# Patient Record
Sex: Female | Born: 1955
Health system: Southern US, Community
[De-identification: ages and names within clinical notes are randomized; demographics above are authoritative.]

## PROBLEM LIST (undated history)

## (undated) DIAGNOSIS — Z9289 Personal history of other medical treatment: Secondary | ICD-10-CM

## (undated) DIAGNOSIS — C801 Malignant (primary) neoplasm, unspecified: Secondary | ICD-10-CM

## (undated) DIAGNOSIS — K219 Gastro-esophageal reflux disease without esophagitis: Secondary | ICD-10-CM

## (undated) DIAGNOSIS — B019 Varicella without complication: Secondary | ICD-10-CM

## (undated) HISTORY — DX: Varicella without complication: B01.9

## (undated) HISTORY — DX: Personal history of other medical treatment: Z92.89

## (undated) HISTORY — PX: TONSILLECTOMY: SUR1361

---

## 2009-11-22 ENCOUNTER — Ambulatory Visit (HOSPITAL_COMMUNITY): Admission: RE | Admit: 2009-11-22 | Discharge: 2009-11-22 | Payer: Self-pay | Admitting: Family Medicine

## 2009-12-14 ENCOUNTER — Encounter: Admission: RE | Admit: 2009-12-14 | Discharge: 2009-12-14 | Payer: Self-pay | Admitting: Family Medicine

## 2009-12-19 ENCOUNTER — Ambulatory Visit: Payer: Self-pay | Admitting: Oncology

## 2009-12-20 ENCOUNTER — Encounter: Admission: RE | Admit: 2009-12-20 | Discharge: 2009-12-20 | Payer: Self-pay | Admitting: Family Medicine

## 2009-12-22 ENCOUNTER — Encounter: Admission: RE | Admit: 2009-12-22 | Discharge: 2009-12-22 | Payer: Self-pay | Admitting: Surgery

## 2009-12-27 ENCOUNTER — Encounter: Admission: RE | Admit: 2009-12-27 | Discharge: 2009-12-27 | Payer: Self-pay | Admitting: Family Medicine

## 2009-12-27 LAB — COMPREHENSIVE METABOLIC PANEL
ALT: 21 U/L (ref 0–35)
AST: 20 U/L (ref 0–37)
Albumin: 4.6 g/dL (ref 3.5–5.2)
Alkaline Phosphatase: 59 U/L (ref 39–117)
Glucose, Bld: 95 mg/dL (ref 70–99)
Potassium: 3.9 mEq/L (ref 3.5–5.3)
Sodium: 141 mEq/L (ref 135–145)
Total Protein: 7 g/dL (ref 6.0–8.3)

## 2009-12-27 LAB — CBC WITH DIFFERENTIAL/PLATELET
BASO%: 0.3 % (ref 0.0–2.0)
EOS%: 1.2 % (ref 0.0–7.0)
Eosinophils Absolute: 0.1 10*3/uL (ref 0.0–0.5)
MCHC: 34.4 g/dL (ref 31.5–36.0)
MCV: 92.1 fL (ref 79.5–101.0)
MONO%: 5.9 % (ref 0.0–14.0)
NEUT#: 3.6 10*3/uL (ref 1.5–6.5)
RBC: 4.44 10*6/uL (ref 3.70–5.45)
RDW: 12.7 % (ref 11.2–14.5)

## 2010-01-02 ENCOUNTER — Ambulatory Visit (HOSPITAL_COMMUNITY): Admission: RE | Admit: 2010-01-02 | Discharge: 2010-01-02 | Payer: Self-pay | Admitting: Oncology

## 2010-01-04 ENCOUNTER — Ambulatory Visit (HOSPITAL_COMMUNITY): Admission: RE | Admit: 2010-01-04 | Discharge: 2010-01-04 | Payer: Self-pay | Admitting: Oncology

## 2010-01-04 ENCOUNTER — Encounter: Payer: Self-pay | Admitting: Oncology

## 2010-01-08 ENCOUNTER — Ambulatory Visit (HOSPITAL_COMMUNITY): Admission: RE | Admit: 2010-01-08 | Discharge: 2010-01-08 | Payer: Self-pay | Admitting: Surgery

## 2010-01-17 LAB — CBC WITH DIFFERENTIAL/PLATELET
EOS%: 1.3 % (ref 0.0–7.0)
MCH: 32 pg (ref 25.1–34.0)
MCV: 92.1 fL (ref 79.5–101.0)
MONO%: 2.2 % (ref 0.0–14.0)
NEUT#: 1.8 10*3/uL (ref 1.5–6.5)
RBC: 3.95 10*6/uL (ref 3.70–5.45)
RDW: 12.7 % (ref 11.2–14.5)

## 2010-01-24 ENCOUNTER — Ambulatory Visit: Payer: Self-pay | Admitting: Oncology

## 2010-01-25 LAB — CBC WITH DIFFERENTIAL/PLATELET
Eosinophils Absolute: 0.1 10*3/uL (ref 0.0–0.5)
MONO#: 1.4 10*3/uL — ABNORMAL HIGH (ref 0.1–0.9)
MONO%: 9.7 % (ref 0.0–14.0)
NEUT#: 11.1 10*3/uL — ABNORMAL HIGH (ref 1.5–6.5)
RBC: 4.29 10*6/uL (ref 3.70–5.45)
RDW: 12.8 % (ref 11.2–14.5)
WBC: 14.2 10*3/uL — ABNORMAL HIGH (ref 3.9–10.3)

## 2010-01-25 LAB — COMPREHENSIVE METABOLIC PANEL
Albumin: 4.5 g/dL (ref 3.5–5.2)
Alkaline Phosphatase: 83 U/L (ref 39–117)
CO2: 25 mEq/L (ref 19–32)
Glucose, Bld: 141 mg/dL — ABNORMAL HIGH (ref 70–99)
Potassium: 3.7 mEq/L (ref 3.5–5.3)
Sodium: 140 mEq/L (ref 135–145)
Total Protein: 6.7 g/dL (ref 6.0–8.3)

## 2010-02-01 LAB — CBC WITH DIFFERENTIAL/PLATELET
Basophils Absolute: 0.1 10*3/uL (ref 0.0–0.1)
Eosinophils Absolute: 0 10*3/uL (ref 0.0–0.5)
HCT: 35.5 % (ref 34.8–46.6)
HGB: 12.3 g/dL (ref 11.6–15.9)
MCH: 30.6 pg (ref 25.1–34.0)
MONO#: 0.2 10*3/uL (ref 0.1–0.9)
NEUT#: 5 10*3/uL (ref 1.5–6.5)
NEUT%: 84.4 % — ABNORMAL HIGH (ref 38.4–76.8)
RDW: 12.7 % (ref 11.2–14.5)
WBC: 5.9 10*3/uL (ref 3.9–10.3)
lymph#: 0.6 10*3/uL — ABNORMAL LOW (ref 0.9–3.3)

## 2010-02-08 LAB — COMPREHENSIVE METABOLIC PANEL
Albumin: 4.3 g/dL (ref 3.5–5.2)
Alkaline Phosphatase: 110 U/L (ref 39–117)
BUN: 9 mg/dL (ref 6–23)
CO2: 26 mEq/L (ref 19–32)
Calcium: 9.2 mg/dL (ref 8.4–10.5)
Chloride: 103 mEq/L (ref 96–112)
Glucose, Bld: 92 mg/dL (ref 70–99)
Potassium: 4.1 mEq/L (ref 3.5–5.3)
Sodium: 141 mEq/L (ref 135–145)
Total Protein: 6.4 g/dL (ref 6.0–8.3)

## 2010-02-08 LAB — CBC WITH DIFFERENTIAL/PLATELET
BASO%: 1.2 % (ref 0.0–2.0)
EOS%: 0.2 % (ref 0.0–7.0)
HCT: 35.4 % (ref 34.8–46.6)
LYMPH%: 9 % — ABNORMAL LOW (ref 14.0–49.7)
MCH: 30.6 pg (ref 25.1–34.0)
MCHC: 34.2 g/dL (ref 31.5–36.0)
NEUT%: 80.1 % — ABNORMAL HIGH (ref 38.4–76.8)
Platelets: 258 10*3/uL (ref 145–400)
RBC: 3.96 10*6/uL (ref 3.70–5.45)

## 2010-02-15 LAB — CBC WITH DIFFERENTIAL/PLATELET
BASO%: 0.5 % (ref 0.0–2.0)
EOS%: 0.1 % (ref 0.0–7.0)
MCH: 30.3 pg (ref 25.1–34.0)
MCHC: 34.3 g/dL (ref 31.5–36.0)
MONO#: 0.3 10*3/uL (ref 0.1–0.9)
NEUT%: 89.5 % — ABNORMAL HIGH (ref 38.4–76.8)
RBC: 3.6 10*6/uL — ABNORMAL LOW (ref 3.70–5.45)
RDW: 13.3 % (ref 11.2–14.5)
WBC: 8.5 10*3/uL (ref 3.9–10.3)
lymph#: 0.5 10*3/uL — ABNORMAL LOW (ref 0.9–3.3)
nRBC: 0 % (ref 0–0)

## 2010-02-22 LAB — CBC WITH DIFFERENTIAL/PLATELET
Eosinophils Absolute: 0 10*3/uL (ref 0.0–0.5)
MCV: 90.5 fL (ref 79.5–101.0)
MONO%: 14.9 % — ABNORMAL HIGH (ref 0.0–14.0)
NEUT#: 6.8 10*3/uL — ABNORMAL HIGH (ref 1.5–6.5)
RBC: 3.58 10*6/uL — ABNORMAL LOW (ref 3.70–5.45)
RDW: 14.3 % (ref 11.2–14.5)
WBC: 9.2 10*3/uL (ref 3.9–10.3)
lymph#: 1 10*3/uL (ref 0.9–3.3)

## 2010-02-22 LAB — COMPREHENSIVE METABOLIC PANEL
AST: 19 U/L (ref 0–37)
Albumin: 4.2 g/dL (ref 3.5–5.2)
Alkaline Phosphatase: 106 U/L (ref 39–117)
Chloride: 108 mEq/L (ref 96–112)
Glucose, Bld: 80 mg/dL (ref 70–99)
Potassium: 4 mEq/L (ref 3.5–5.3)
Sodium: 141 mEq/L (ref 135–145)
Total Protein: 5.9 g/dL — ABNORMAL LOW (ref 6.0–8.3)

## 2010-02-23 ENCOUNTER — Ambulatory Visit: Payer: Self-pay | Admitting: Oncology

## 2010-02-27 ENCOUNTER — Ambulatory Visit (HOSPITAL_COMMUNITY): Admission: RE | Admit: 2010-02-27 | Discharge: 2010-02-27 | Payer: Self-pay | Admitting: Oncology

## 2010-03-01 LAB — CBC WITH DIFFERENTIAL/PLATELET
Basophils Absolute: 0.1 10*3/uL (ref 0.0–0.1)
Eosinophils Absolute: 0 10*3/uL (ref 0.0–0.5)
HCT: 30 % — ABNORMAL LOW (ref 34.8–46.6)
HGB: 10 g/dL — ABNORMAL LOW (ref 11.6–15.9)
MONO#: 0.3 10*3/uL (ref 0.1–0.9)
NEUT%: 87.4 % — ABNORMAL HIGH (ref 38.4–76.8)
WBC: 8.2 10*3/uL (ref 3.9–10.3)
lymph#: 0.6 10*3/uL — ABNORMAL LOW (ref 0.9–3.3)

## 2010-03-08 LAB — COMPREHENSIVE METABOLIC PANEL
AST: 20 U/L (ref 0–37)
Albumin: 4.2 g/dL (ref 3.5–5.2)
BUN: 11 mg/dL (ref 6–23)
Calcium: 9.4 mg/dL (ref 8.4–10.5)
Chloride: 106 mEq/L (ref 96–112)
Glucose, Bld: 129 mg/dL — ABNORMAL HIGH (ref 70–99)
Potassium: 3.7 mEq/L (ref 3.5–5.3)

## 2010-03-08 LAB — CBC WITH DIFFERENTIAL/PLATELET
BASO%: 0.2 % (ref 0.0–2.0)
Basophils Absolute: 0 10*3/uL (ref 0.0–0.1)
EOS%: 0 % (ref 0.0–7.0)
HGB: 10.6 g/dL — ABNORMAL LOW (ref 11.6–15.9)
MCH: 31.1 pg (ref 25.1–34.0)
RDW: 16.4 % — ABNORMAL HIGH (ref 11.2–14.5)
lymph#: 0.6 10*3/uL — ABNORMAL LOW (ref 0.9–3.3)

## 2010-03-15 LAB — CBC WITH DIFFERENTIAL/PLATELET
Eosinophils Absolute: 0 10*3/uL (ref 0.0–0.5)
MONO#: 1.8 10*3/uL — ABNORMAL HIGH (ref 0.1–0.9)
NEUT#: 18.2 10*3/uL — ABNORMAL HIGH (ref 1.5–6.5)
Platelets: 214 10*3/uL (ref 145–400)
RBC: 3.4 10*6/uL — ABNORMAL LOW (ref 3.70–5.45)
RDW: 16.3 % — ABNORMAL HIGH (ref 11.2–14.5)
WBC: 21.3 10*3/uL — ABNORMAL HIGH (ref 3.9–10.3)
lymph#: 1.2 10*3/uL (ref 0.9–3.3)

## 2010-03-22 LAB — CBC WITH DIFFERENTIAL/PLATELET
Eosinophils Absolute: 0 10*3/uL (ref 0.0–0.5)
HCT: 34.1 % — ABNORMAL LOW (ref 34.8–46.6)
HGB: 11.3 g/dL — ABNORMAL LOW (ref 11.6–15.9)
LYMPH%: 6.9 % — ABNORMAL LOW (ref 14.0–49.7)
MONO#: 1.9 10*3/uL — ABNORMAL HIGH (ref 0.1–0.9)
NEUT#: 14.9 10*3/uL — ABNORMAL HIGH (ref 1.5–6.5)
NEUT%: 81.9 % — ABNORMAL HIGH (ref 38.4–76.8)
Platelets: 289 10*3/uL (ref 145–400)
WBC: 18.2 10*3/uL — ABNORMAL HIGH (ref 3.9–10.3)

## 2010-03-22 LAB — COMPREHENSIVE METABOLIC PANEL
CO2: 26 mEq/L (ref 19–32)
Calcium: 9.3 mg/dL (ref 8.4–10.5)
Chloride: 105 mEq/L (ref 96–112)
Creatinine, Ser: 0.57 mg/dL (ref 0.40–1.20)
Glucose, Bld: 89 mg/dL (ref 70–99)
Total Bilirubin: 0.4 mg/dL (ref 0.3–1.2)
Total Protein: 6.3 g/dL (ref 6.0–8.3)

## 2010-03-27 ENCOUNTER — Ambulatory Visit: Payer: Self-pay | Admitting: Oncology

## 2010-03-29 LAB — CBC WITH DIFFERENTIAL/PLATELET
BASO%: 0.8 % (ref 0.0–2.0)
Eosinophils Absolute: 0 10*3/uL (ref 0.0–0.5)
HCT: 29.4 % — ABNORMAL LOW (ref 34.8–46.6)
LYMPH%: 14.6 % (ref 14.0–49.7)
MONO#: 0.5 10*3/uL (ref 0.1–0.9)
NEUT#: 5.7 10*3/uL (ref 1.5–6.5)
Platelets: 221 10*3/uL (ref 145–400)
RBC: 3.16 10*6/uL — ABNORMAL LOW (ref 3.70–5.45)
WBC: 7.4 10*3/uL (ref 3.9–10.3)
lymph#: 1.1 10*3/uL (ref 0.9–3.3)
nRBC: 0 % (ref 0–0)

## 2010-04-05 ENCOUNTER — Encounter (HOSPITAL_COMMUNITY): Admission: RE | Admit: 2010-04-05 | Discharge: 2010-04-05 | Payer: Self-pay | Admitting: Oncology

## 2010-04-05 LAB — CBC WITH DIFFERENTIAL/PLATELET
Basophils Absolute: 0 10*3/uL (ref 0.0–0.1)
EOS%: 2.3 % (ref 0.0–7.0)
Eosinophils Absolute: 0.1 10*3/uL (ref 0.0–0.5)
HCT: 25.6 % — ABNORMAL LOW (ref 34.8–46.6)
HGB: 8.5 g/dL — ABNORMAL LOW (ref 11.6–15.9)
LYMPH%: 16.3 % (ref 14.0–49.7)
MCH: 31.4 pg (ref 25.1–34.0)
MCV: 94.5 fL (ref 79.5–101.0)
MONO%: 7.7 % (ref 0.0–14.0)
NEUT#: 3.5 10*3/uL (ref 1.5–6.5)
NEUT%: 73.1 % (ref 38.4–76.8)
Platelets: 239 10*3/uL (ref 145–400)
RDW: 16.7 % — ABNORMAL HIGH (ref 11.2–14.5)

## 2010-04-12 LAB — COMPREHENSIVE METABOLIC PANEL
AST: 25 U/L (ref 0–37)
Albumin: 4.5 g/dL (ref 3.5–5.2)
BUN: 14 mg/dL (ref 6–23)
Calcium: 9.1 mg/dL (ref 8.4–10.5)
Chloride: 104 mEq/L (ref 96–112)
Glucose, Bld: 90 mg/dL (ref 70–99)
Potassium: 3.8 mEq/L (ref 3.5–5.3)
Total Protein: 6.4 g/dL (ref 6.0–8.3)

## 2010-04-12 LAB — CBC WITH DIFFERENTIAL/PLATELET
HCT: 38.2 % (ref 34.8–46.6)
LYMPH%: 21.9 % (ref 14.0–49.7)
MCH: 32 pg (ref 25.1–34.0)
MCHC: 33.8 g/dL (ref 31.5–36.0)
MCV: 94.8 fL (ref 79.5–101.0)
MONO#: 0.5 10*3/uL (ref 0.1–0.9)
MONO%: 15 % — ABNORMAL HIGH (ref 0.0–14.0)
NEUT%: 58.6 % (ref 38.4–76.8)
Platelets: 217 10*3/uL (ref 145–400)
RBC: 4.03 10*6/uL (ref 3.70–5.45)
WBC: 3.3 10*3/uL — ABNORMAL LOW (ref 3.9–10.3)

## 2010-04-20 LAB — COMPREHENSIVE METABOLIC PANEL WITH GFR
ALT: 16 U/L (ref 0–35)
AST: 21 U/L (ref 0–37)
Albumin: 4.2 g/dL (ref 3.5–5.2)
Alkaline Phosphatase: 53 U/L (ref 39–117)
BUN: 16 mg/dL (ref 6–23)
CO2: 22 meq/L (ref 19–32)
Calcium: 8.8 mg/dL (ref 8.4–10.5)
Chloride: 105 meq/L (ref 96–112)
Creatinine, Ser: 0.54 mg/dL (ref 0.40–1.20)
Glucose, Bld: 101 mg/dL — ABNORMAL HIGH (ref 70–99)
Potassium: 4.1 meq/L (ref 3.5–5.3)
Sodium: 139 meq/L (ref 135–145)
Total Bilirubin: 0.6 mg/dL (ref 0.3–1.2)
Total Protein: 6 g/dL (ref 6.0–8.3)

## 2010-04-20 LAB — CBC WITH DIFFERENTIAL/PLATELET
BASO%: 3.4 % — ABNORMAL HIGH (ref 0.0–2.0)
Basophils Absolute: 0.1 10*3/uL (ref 0.0–0.1)
EOS%: 2.4 % (ref 0.0–7.0)
HGB: 12.8 g/dL (ref 11.6–15.9)
MCH: 32 pg (ref 25.1–34.0)
RBC: 4 10*6/uL (ref 3.70–5.45)
RDW: 14.5 % (ref 11.2–14.5)
lymph#: 0.9 10*3/uL (ref 0.9–3.3)
nRBC: 0 % (ref 0–0)

## 2010-04-26 ENCOUNTER — Ambulatory Visit: Payer: Self-pay | Admitting: Oncology

## 2010-04-26 LAB — CBC WITH DIFFERENTIAL/PLATELET
Eosinophils Absolute: 0 10*3/uL (ref 0.0–0.5)
HGB: 12 g/dL (ref 11.6–15.9)
MONO#: 0.1 10*3/uL (ref 0.1–0.9)
NEUT#: 2.2 10*3/uL (ref 1.5–6.5)
Platelets: 152 10*3/uL (ref 145–400)
RBC: 3.71 10*6/uL (ref 3.70–5.45)
RDW: 13.9 % (ref 11.2–14.5)
WBC: 3.1 10*3/uL — ABNORMAL LOW (ref 3.9–10.3)
nRBC: 0 % (ref 0–0)

## 2010-05-03 LAB — CBC WITH DIFFERENTIAL/PLATELET
BASO%: 0.8 % (ref 0.0–2.0)
Eosinophils Absolute: 0.1 10*3/uL (ref 0.0–0.5)
MONO#: 0.2 10*3/uL (ref 0.1–0.9)
NEUT#: 1.8 10*3/uL (ref 1.5–6.5)
RBC: 3.42 10*6/uL — ABNORMAL LOW (ref 3.70–5.45)
RDW: 13.8 % (ref 11.2–14.5)
WBC: 2.7 10*3/uL — ABNORMAL LOW (ref 3.9–10.3)
lymph#: 0.6 10*3/uL — ABNORMAL LOW (ref 0.9–3.3)

## 2010-05-09 LAB — CBC WITH DIFFERENTIAL/PLATELET
Eosinophils Absolute: 0.1 10*3/uL (ref 0.0–0.5)
HCT: 34.1 % — ABNORMAL LOW (ref 34.8–46.6)
LYMPH%: 23.6 % (ref 14.0–49.7)
MONO#: 0.5 10*3/uL (ref 0.1–0.9)
NEUT#: 2.1 10*3/uL (ref 1.5–6.5)
NEUT%: 58.8 % (ref 38.4–76.8)
Platelets: 220 10*3/uL (ref 145–400)
WBC: 3.5 10*3/uL — ABNORMAL LOW (ref 3.9–10.3)
lymph#: 0.8 10*3/uL — ABNORMAL LOW (ref 0.9–3.3)
nRBC: 0 % (ref 0–0)

## 2010-05-17 LAB — CBC WITH DIFFERENTIAL/PLATELET
Basophils Absolute: 0 10*3/uL (ref 0.0–0.1)
EOS%: 1.9 % (ref 0.0–7.0)
Eosinophils Absolute: 0.1 10*3/uL (ref 0.0–0.5)
HCT: 40.2 % (ref 34.8–46.6)
HGB: 13.8 g/dL (ref 11.6–15.9)
MCH: 32.8 pg (ref 25.1–34.0)
NEUT#: 2.4 10*3/uL (ref 1.5–6.5)
NEUT%: 63.9 % (ref 38.4–76.8)
RDW: 13.7 % (ref 11.2–14.5)
lymph#: 0.8 10*3/uL — ABNORMAL LOW (ref 0.9–3.3)

## 2010-05-22 ENCOUNTER — Ambulatory Visit (HOSPITAL_COMMUNITY): Admission: RE | Admit: 2010-05-22 | Discharge: 2010-05-22 | Payer: Self-pay | Admitting: Oncology

## 2010-06-27 ENCOUNTER — Inpatient Hospital Stay (HOSPITAL_COMMUNITY): Admission: RE | Admit: 2010-06-27 | Discharge: 2010-06-29 | Payer: Self-pay | Admitting: Surgery

## 2010-06-27 ENCOUNTER — Encounter (INDEPENDENT_AMBULATORY_CARE_PROVIDER_SITE_OTHER): Payer: Self-pay | Admitting: Surgery

## 2010-06-29 HISTORY — PX: MASTECTOMY: SHX3

## 2010-07-06 ENCOUNTER — Ambulatory Visit: Payer: Self-pay | Admitting: Oncology

## 2010-07-10 LAB — COMPREHENSIVE METABOLIC PANEL
ALT: 23 U/L (ref 0–35)
AST: 20 U/L (ref 0–37)
Albumin: 4.2 g/dL (ref 3.5–5.2)
Alkaline Phosphatase: 59 U/L (ref 39–117)
BUN: 17 mg/dL (ref 6–23)
CO2: 30 mEq/L (ref 19–32)
Calcium: 9.7 mg/dL (ref 8.4–10.5)
Chloride: 102 mEq/L (ref 96–112)
Creatinine, Ser: 0.6 mg/dL (ref 0.40–1.20)
Glucose, Bld: 97 mg/dL (ref 70–99)
Potassium: 4.2 mEq/L (ref 3.5–5.3)
Sodium: 140 mEq/L (ref 135–145)
Total Bilirubin: 0.4 mg/dL (ref 0.3–1.2)
Total Protein: 6.2 g/dL (ref 6.0–8.3)

## 2010-07-10 LAB — CBC WITH DIFFERENTIAL/PLATELET
BASO%: 0.2 % (ref 0.0–2.0)
Basophils Absolute: 0 10*3/uL (ref 0.0–0.1)
EOS%: 1.2 % (ref 0.0–7.0)
Eosinophils Absolute: 0.1 10*3/uL (ref 0.0–0.5)
HCT: 33.5 % — ABNORMAL LOW (ref 34.8–46.6)
HGB: 11.7 g/dL (ref 11.6–15.9)
LYMPH%: 9.9 % — ABNORMAL LOW (ref 14.0–49.7)
MCH: 32.8 pg (ref 25.1–34.0)
MCHC: 34.9 g/dL (ref 31.5–36.0)
MCV: 93.9 fL (ref 79.5–101.0)
MONO#: 0.4 10*3/uL (ref 0.1–0.9)
MONO%: 5.6 % (ref 0.0–14.0)
NEUT#: 5.4 10*3/uL (ref 1.5–6.5)
NEUT%: 83.1 % — ABNORMAL HIGH (ref 38.4–76.8)
Platelets: 358 10*3/uL (ref 145–400)
RBC: 3.57 10*6/uL — ABNORMAL LOW (ref 3.70–5.45)
RDW: 13.1 % (ref 11.2–14.5)
WBC: 6.5 10*3/uL (ref 3.9–10.3)
lymph#: 0.6 10*3/uL — ABNORMAL LOW (ref 0.9–3.3)

## 2010-07-10 LAB — LUTEINIZING HORMONE: LH: 46.4 m[IU]/mL

## 2010-07-10 LAB — FOLLICLE STIMULATING HORMONE: FSH: 119 m[IU]/mL — ABNORMAL HIGH

## 2010-07-11 ENCOUNTER — Encounter: Admission: RE | Admit: 2010-07-11 | Discharge: 2010-07-11 | Payer: Self-pay | Admitting: Oncology

## 2010-07-17 LAB — ESTRADIOL, ULTRA SENS: Estradiol, Ultra Sensitive: 4 pg/mL

## 2010-07-19 ENCOUNTER — Ambulatory Visit
Admission: RE | Admit: 2010-07-19 | Discharge: 2010-10-17 | Payer: Self-pay | Source: Home / Self Care | Attending: Radiation Oncology | Admitting: Radiation Oncology

## 2010-08-06 ENCOUNTER — Encounter (INDEPENDENT_AMBULATORY_CARE_PROVIDER_SITE_OTHER): Payer: Self-pay | Admitting: Surgery

## 2010-08-06 ENCOUNTER — Ambulatory Visit (HOSPITAL_COMMUNITY): Admission: RE | Admit: 2010-08-06 | Discharge: 2010-08-07 | Payer: Self-pay | Admitting: Surgery

## 2010-08-06 HISTORY — PX: AXILLARY LYMPH NODE DISSECTION: SHX5229

## 2010-08-28 ENCOUNTER — Ambulatory Visit: Payer: Self-pay | Admitting: Oncology

## 2010-08-29 LAB — CBC WITH DIFFERENTIAL/PLATELET
BASO%: 0.5 % (ref 0.0–2.0)
Basophils Absolute: 0 10*3/uL (ref 0.0–0.1)
EOS%: 1.4 % (ref 0.0–7.0)
MCH: 32.2 pg (ref 25.1–34.0)
MCHC: 34.4 g/dL (ref 31.5–36.0)
MCV: 93.5 fL (ref 79.5–101.0)
MONO%: 8.6 % (ref 0.0–14.0)
RBC: 4.13 10*6/uL (ref 3.70–5.45)
RDW: 12.6 % (ref 11.2–14.5)
lymph#: 0.8 10*3/uL — ABNORMAL LOW (ref 0.9–3.3)

## 2010-08-29 LAB — COMPREHENSIVE METABOLIC PANEL
ALT: 20 U/L (ref 0–35)
AST: 17 U/L (ref 0–37)
Albumin: 4.7 g/dL (ref 3.5–5.2)
Alkaline Phosphatase: 67 U/L (ref 39–117)
BUN: 16 mg/dL (ref 6–23)
Potassium: 4.2 mEq/L (ref 3.5–5.3)
Sodium: 143 mEq/L (ref 135–145)

## 2010-10-18 ENCOUNTER — Ambulatory Visit
Admission: RE | Admit: 2010-10-18 | Discharge: 2010-10-30 | Payer: Self-pay | Source: Home / Self Care | Attending: Radiation Oncology | Admitting: Radiation Oncology

## 2010-10-21 ENCOUNTER — Encounter: Payer: Self-pay | Admitting: Family Medicine

## 2010-10-22 ENCOUNTER — Encounter
Admission: RE | Admit: 2010-10-22 | Discharge: 2010-10-30 | Payer: Self-pay | Source: Home / Self Care | Attending: Surgery | Admitting: Surgery

## 2010-10-31 ENCOUNTER — Ambulatory Visit: Payer: Self-pay | Admitting: Radiation Oncology

## 2010-10-31 ENCOUNTER — Ambulatory Visit: Payer: BC Managed Care – PPO | Attending: Radiation Oncology | Admitting: Radiation Oncology

## 2010-10-31 DIAGNOSIS — M79609 Pain in unspecified limb: Secondary | ICD-10-CM | POA: Insufficient documentation

## 2010-10-31 DIAGNOSIS — L589 Radiodermatitis, unspecified: Secondary | ICD-10-CM | POA: Insufficient documentation

## 2010-10-31 DIAGNOSIS — C50219 Malignant neoplasm of upper-inner quadrant of unspecified female breast: Secondary | ICD-10-CM | POA: Insufficient documentation

## 2010-10-31 DIAGNOSIS — Z510 Encounter for antineoplastic radiation therapy: Secondary | ICD-10-CM | POA: Insufficient documentation

## 2010-10-31 DIAGNOSIS — Y842 Radiological procedure and radiotherapy as the cause of abnormal reaction of the patient, or of later complication, without mention of misadventure at the time of the procedure: Secondary | ICD-10-CM | POA: Insufficient documentation

## 2010-11-05 ENCOUNTER — Ambulatory Visit: Payer: BC Managed Care – PPO | Admitting: Physical Therapy

## 2010-11-06 ENCOUNTER — Encounter (HOSPITAL_BASED_OUTPATIENT_CLINIC_OR_DEPARTMENT_OTHER): Payer: BC Managed Care – PPO | Admitting: Oncology

## 2010-11-06 ENCOUNTER — Other Ambulatory Visit: Payer: Self-pay | Admitting: Physician Assistant

## 2010-11-06 DIAGNOSIS — G609 Hereditary and idiopathic neuropathy, unspecified: Secondary | ICD-10-CM

## 2010-11-06 DIAGNOSIS — C50219 Malignant neoplasm of upper-inner quadrant of unspecified female breast: Secondary | ICD-10-CM

## 2010-11-06 DIAGNOSIS — Z170 Estrogen receptor positive status [ER+]: Secondary | ICD-10-CM

## 2010-11-06 LAB — COMPREHENSIVE METABOLIC PANEL
Albumin: 4.5 g/dL (ref 3.5–5.2)
Alkaline Phosphatase: 57 U/L (ref 39–117)
BUN: 13 mg/dL (ref 6–23)
CO2: 30 mEq/L (ref 19–32)
Calcium: 9.5 mg/dL (ref 8.4–10.5)
Chloride: 103 mEq/L (ref 96–112)
Glucose, Bld: 104 mg/dL — ABNORMAL HIGH (ref 70–99)
Potassium: 3.6 mEq/L (ref 3.5–5.3)

## 2010-11-06 LAB — LACTATE DEHYDROGENASE: LDH: 131 U/L (ref 94–250)

## 2010-11-06 LAB — CBC WITH DIFFERENTIAL/PLATELET
Basophils Absolute: 0 10*3/uL (ref 0.0–0.1)
Eosinophils Absolute: 0.1 10*3/uL (ref 0.0–0.5)
HGB: 13.1 g/dL (ref 11.6–15.9)
MCV: 93.5 fL (ref 79.5–101.0)
MONO%: 4.9 % (ref 0.0–14.0)
NEUT#: 5.4 10*3/uL (ref 1.5–6.5)
RDW: 13.4 % (ref 11.2–14.5)

## 2010-11-07 ENCOUNTER — Ambulatory Visit: Payer: BC Managed Care – PPO | Admitting: Physical Therapy

## 2010-11-12 ENCOUNTER — Ambulatory Visit: Payer: BC Managed Care – PPO | Attending: Oncology | Admitting: Physical Therapy

## 2010-11-12 DIAGNOSIS — M24519 Contracture, unspecified shoulder: Secondary | ICD-10-CM | POA: Insufficient documentation

## 2010-11-12 DIAGNOSIS — IMO0001 Reserved for inherently not codable concepts without codable children: Secondary | ICD-10-CM | POA: Insufficient documentation

## 2010-11-12 DIAGNOSIS — I890 Lymphedema, not elsewhere classified: Secondary | ICD-10-CM | POA: Insufficient documentation

## 2010-11-12 DIAGNOSIS — M25519 Pain in unspecified shoulder: Secondary | ICD-10-CM | POA: Insufficient documentation

## 2010-11-14 ENCOUNTER — Ambulatory Visit: Payer: BC Managed Care – PPO | Admitting: Physical Therapy

## 2010-11-19 ENCOUNTER — Ambulatory Visit: Payer: BC Managed Care – PPO | Admitting: Physical Therapy

## 2010-11-21 ENCOUNTER — Ambulatory Visit: Payer: BC Managed Care – PPO | Admitting: Physical Therapy

## 2010-12-03 ENCOUNTER — Ambulatory Visit: Payer: BC Managed Care – PPO | Attending: Oncology | Admitting: Physical Therapy

## 2010-12-03 DIAGNOSIS — M25519 Pain in unspecified shoulder: Secondary | ICD-10-CM | POA: Insufficient documentation

## 2010-12-03 DIAGNOSIS — M24519 Contracture, unspecified shoulder: Secondary | ICD-10-CM | POA: Insufficient documentation

## 2010-12-03 DIAGNOSIS — I890 Lymphedema, not elsewhere classified: Secondary | ICD-10-CM | POA: Insufficient documentation

## 2010-12-03 DIAGNOSIS — IMO0001 Reserved for inherently not codable concepts without codable children: Secondary | ICD-10-CM | POA: Insufficient documentation

## 2010-12-11 LAB — CBC
HCT: 39.7 % (ref 36.0–46.0)
Hemoglobin: 13.5 g/dL (ref 12.0–15.0)
MCH: 31.3 pg (ref 26.0–34.0)
MCHC: 34 g/dL (ref 30.0–36.0)
RDW: 12.5 % (ref 11.5–15.5)

## 2010-12-11 LAB — SURGICAL PCR SCREEN: Staphylococcus aureus: NEGATIVE

## 2010-12-13 LAB — CBC
HCT: 38.1 % (ref 36.0–46.0)
Hemoglobin: 10.8 g/dL — ABNORMAL LOW (ref 12.0–15.0)
Hemoglobin: 13.5 g/dL (ref 12.0–15.0)
MCH: 31.5 pg (ref 26.0–34.0)
MCH: 32.4 pg (ref 26.0–34.0)
MCHC: 34.2 g/dL (ref 30.0–36.0)
MCHC: 35.4 g/dL (ref 30.0–36.0)
Platelets: 150 10*3/uL (ref 150–400)
Platelets: 164 10*3/uL (ref 150–400)
RBC: 3.1 MIL/uL — ABNORMAL LOW (ref 3.87–5.11)
RDW: 12.7 % (ref 11.5–15.5)
RDW: 12.7 % (ref 11.5–15.5)
WBC: 8.8 10*3/uL (ref 4.0–10.5)

## 2010-12-13 LAB — BASIC METABOLIC PANEL
CO2: 27 mEq/L (ref 19–32)
Calcium: 8.3 mg/dL — ABNORMAL LOW (ref 8.4–10.5)
Creatinine, Ser: 0.55 mg/dL (ref 0.4–1.2)
GFR calc non Af Amer: >60 mL/min (ref >60)
Glucose, Bld: 140 mg/dL — ABNORMAL HIGH (ref 70–99)
Sodium: 137 mEq/L (ref 135–145)

## 2010-12-13 LAB — TYPE AND SCREEN
ABO/RH(D): A POS
Antibody Screen: NEGATIVE

## 2010-12-13 LAB — COMPREHENSIVE METABOLIC PANEL
AST: 22 U/L (ref 0–37)
Albumin: 4.2 g/dL (ref 3.5–5.2)
BUN: 13 mg/dL (ref 6–23)
Calcium: 9.7 mg/dL (ref 8.4–10.5)
Creatinine, Ser: 0.63 mg/dL (ref 0.4–1.2)
GFR calc Af Amer: >60 mL/min (ref >60)
GFR calc non Af Amer: >60 mL/min (ref >60)

## 2010-12-13 LAB — DIFFERENTIAL
Eosinophils Relative: 2 % (ref 0–5)
Lymphocytes Relative: 16 % (ref 12–46)
Lymphs Abs: 0.8 10*3/uL (ref 0.7–4.0)
Monocytes Absolute: 0.4 10*3/uL (ref 0.1–1.0)
Neutro Abs: 3.4 10*3/uL (ref 1.7–7.7)

## 2010-12-13 LAB — SURGICAL PCR SCREEN: Staphylococcus aureus: NEGATIVE

## 2010-12-16 LAB — CROSSMATCH
ABO/RH(D): A POS
Antibody Screen: NEGATIVE

## 2010-12-16 LAB — ABO/RH: ABO/RH(D): A POS

## 2010-12-18 ENCOUNTER — Ambulatory Visit: Payer: BC Managed Care – PPO | Admitting: Physical Therapy

## 2010-12-19 LAB — DIFFERENTIAL
Basophils Absolute: 0 10*3/uL (ref 0.0–0.1)
Eosinophils Absolute: 0.1 10*3/uL (ref 0.0–0.7)
Eosinophils Relative: 2 % (ref 0–5)
Lymphocytes Relative: 21 % (ref 12–46)
Lymphs Abs: 1 10*3/uL (ref 0.7–4.0)
Neutrophils Relative %: 69 % (ref 43–77)

## 2010-12-19 LAB — CBC
HCT: 39.9 % (ref 36.0–46.0)
Platelets: 207 10*3/uL (ref 150–400)
RDW: 12.7 % (ref 11.5–15.5)
WBC: 4.7 10*3/uL (ref 4.0–10.5)

## 2010-12-20 IMAGING — PT NM PET TUM IMG INITIAL (PI) SKULL BASE T - THIGH
7 series · 25 of 25 positions shown · non-contrast
Comparison: None

Addendum Begins

Additional finding:  4 mm nodule in the right middle lobe.  No
associated F D G activity.  This nodule is indeterminate.
Recommend attention on follow-up.
Addendum Ends
CLINICAL DATA: Initial treatment strategy for breast cancer.  New
diagnosis of bilateral breast cancer. Invasive ductal carcinoma.
NUCLEAR MEDICINE PET SKULL BASE TO THIGH
Fasting Blood Glucose:  94
TECHNIQUE: 17.4 mCi F-18 FDG was injected intravenously via the
right antecubital fossa.  Full-ring PET imaging was performed from
the skull base through the mid-thighs 58  minutes after injection.
CT data was obtained and used for attenuation correction and
anatomic localization only.  (This was not acquired as a diagnostic
CT examination.)

[Series 1: pet ac · axial · 3.3mm · 4.69mm/px · z∈[-895,-25]mm · 5 of 267 slices shown]
[im 1/267]
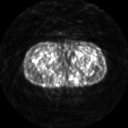
[im 67/267]
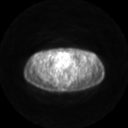
[im 134/267]
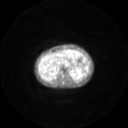
[im 200/267]
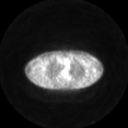
[im 267/267]
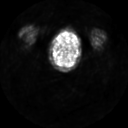

[Series 2: ct images · axial · 3.8mm · 0.98mm/px · z∈[-895,-26]mm · 5 of 267 slices shown]
[im 1/267]
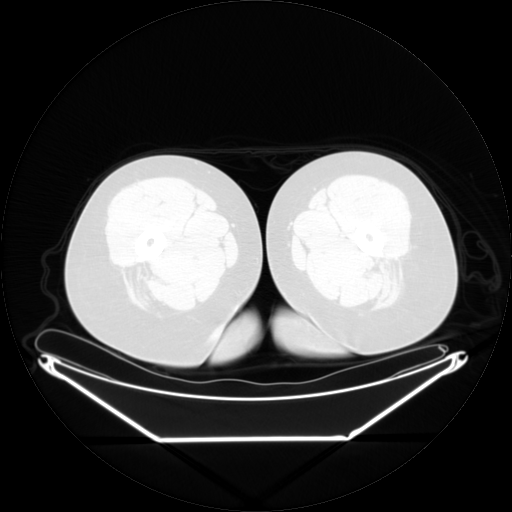
[im 67/267]
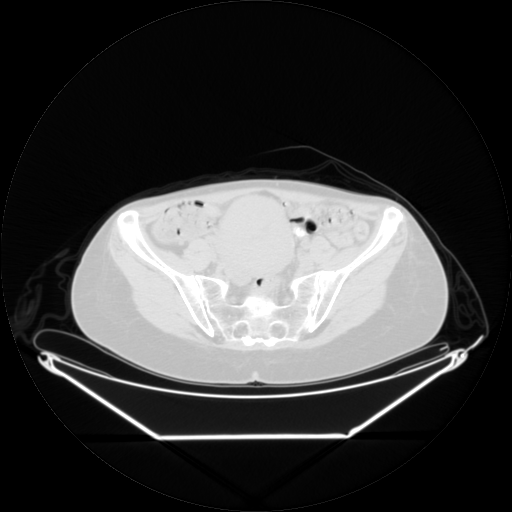
[im 134/267]
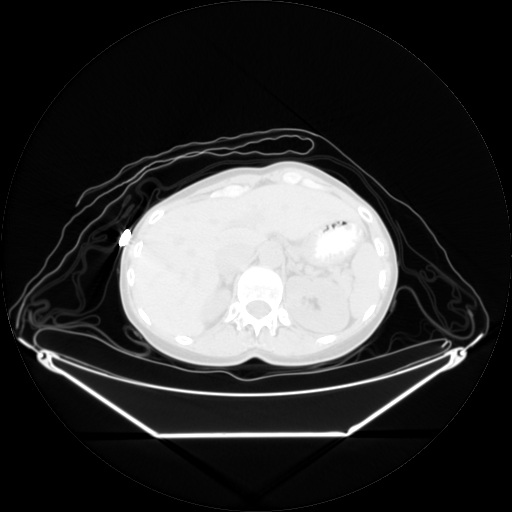
[im 200/267]
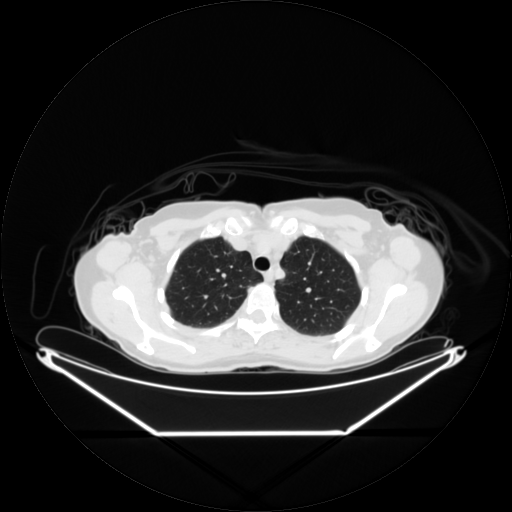
[im 267/267  brain]
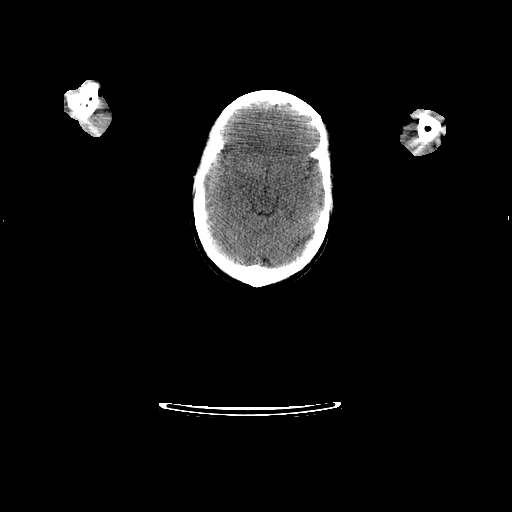

[Series 2: pet nac · axial · 3.3mm · 4.69mm/px · z∈[-895,-25]mm · 5 of 267 slices shown]
[im 1/267]
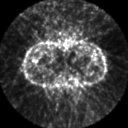
[im 67/267]
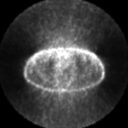
[im 134/267]
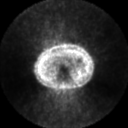
[im 200/267]
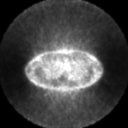
[im 267/267]
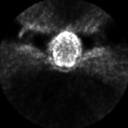

[Series 123: mip · coronal · 3.3mm · 4.69mm/px · 1 of 30 slices shown]
[im 1/30]
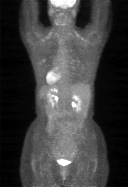

[Series 151: reformatted · axial · 3.3mm · 3.91mm/px · z∈[-895,-25]mm · 6 of 267 slices shown (1 of 3)]
[im 1/267]
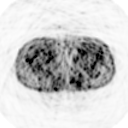
[im 54/267]
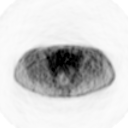
[im 107/267]
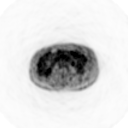
[im 160/267]
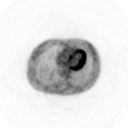
[im 213/267]
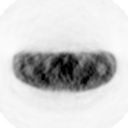
[im 267/267]
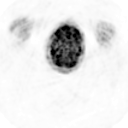

[Series 153: reformatted · coronal · 4.7mm · 6.98mm/px · 2 of 74 slices shown (2 of 3)]
[im 1/74]
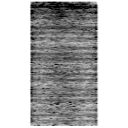
[im 74/74]
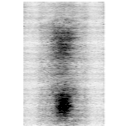

[Series 250: reformatted · axial · 3.8mm · 0.49mm/px · 1 of 1 slices shown (3 of 3)]
[im 1/1]
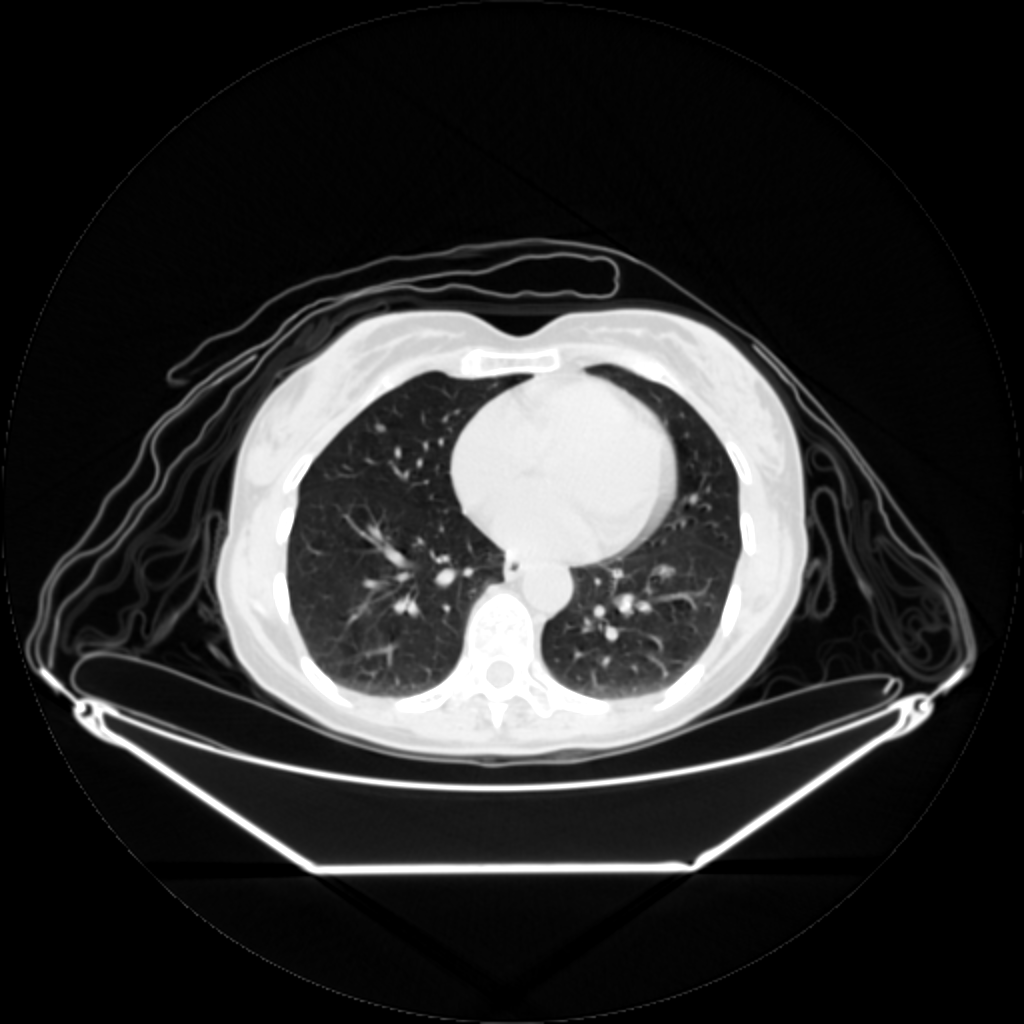

[25 of 25 positions shown; findings below may reference images not displayed]

FINDINGS: Neck:No hypermetabolic nodes in the neck.

Chest:Within the medial left breast there is a focus of increased
metabolic activity adjacent to the biopsy clip (image 87, S U V max
4.0).  There is no evidence of hypermetabolic left axillary nodes.
There is no clear elevated hypermetabolic foci within the right
breast above background glandular activity.  No right axillary
hypermetabolic nodes.

No evidence of hypermetabolic supraclavicular or infraclavicular
nodes.  No hypermetabolic internal mammary nodes.

No hypermetabolic mediastinal or hilar nodes.  No suspicious
pulmonary nodules.

Abdomen / Pelvis:No abnormal hypermetabolic activity within the
solid organs.  No evidence of abdominal or pelvic hypermetabolic
nodes. The uterus is enlarged and globular measuring 13 cm x 11 cm
x 9 cm.

Skeleton:No focal hypermetabolic activity to suggest skeletal
metastasis.
IMPRESSION: 1.  No evidence of breast cancer metastasis.
2.  Focal hypermetabolic activity within the left breast consistent
with primary carcinoma.
3.  Leiomyomatous uterus.

## 2010-12-25 ENCOUNTER — Other Ambulatory Visit: Payer: Self-pay | Admitting: Oncology

## 2010-12-25 ENCOUNTER — Encounter (HOSPITAL_BASED_OUTPATIENT_CLINIC_OR_DEPARTMENT_OTHER): Payer: BC Managed Care – PPO | Admitting: Oncology

## 2010-12-25 DIAGNOSIS — G609 Hereditary and idiopathic neuropathy, unspecified: Secondary | ICD-10-CM

## 2010-12-25 DIAGNOSIS — D649 Anemia, unspecified: Secondary | ICD-10-CM

## 2010-12-25 DIAGNOSIS — C50219 Malignant neoplasm of upper-inner quadrant of unspecified female breast: Secondary | ICD-10-CM

## 2010-12-25 DIAGNOSIS — Z170 Estrogen receptor positive status [ER+]: Secondary | ICD-10-CM

## 2010-12-26 LAB — COMPREHENSIVE METABOLIC PANEL
ALT: 17 U/L (ref 0–35)
Albumin: 4.4 g/dL (ref 3.5–5.2)
Alkaline Phosphatase: 53 U/L (ref 39–117)
CO2: 26 mEq/L (ref 19–32)
Potassium: 3.8 mEq/L (ref 3.5–5.3)
Sodium: 141 mEq/L (ref 135–145)
Total Bilirubin: 0.4 mg/dL (ref 0.3–1.2)
Total Protein: 6 g/dL (ref 6.0–8.3)

## 2010-12-26 IMAGING — CR DG CHEST 1V PORT
1 series · 1 of 1 positions shown · non-contrast
Comparison: CT chest 01/02/2010.

CLINICAL DATA: Port-A-Cath placement.  Breast cancer.

PORTABLE CHEST - 1 VIEW [DATE]/9200 0409 hours:

[view not recorded]
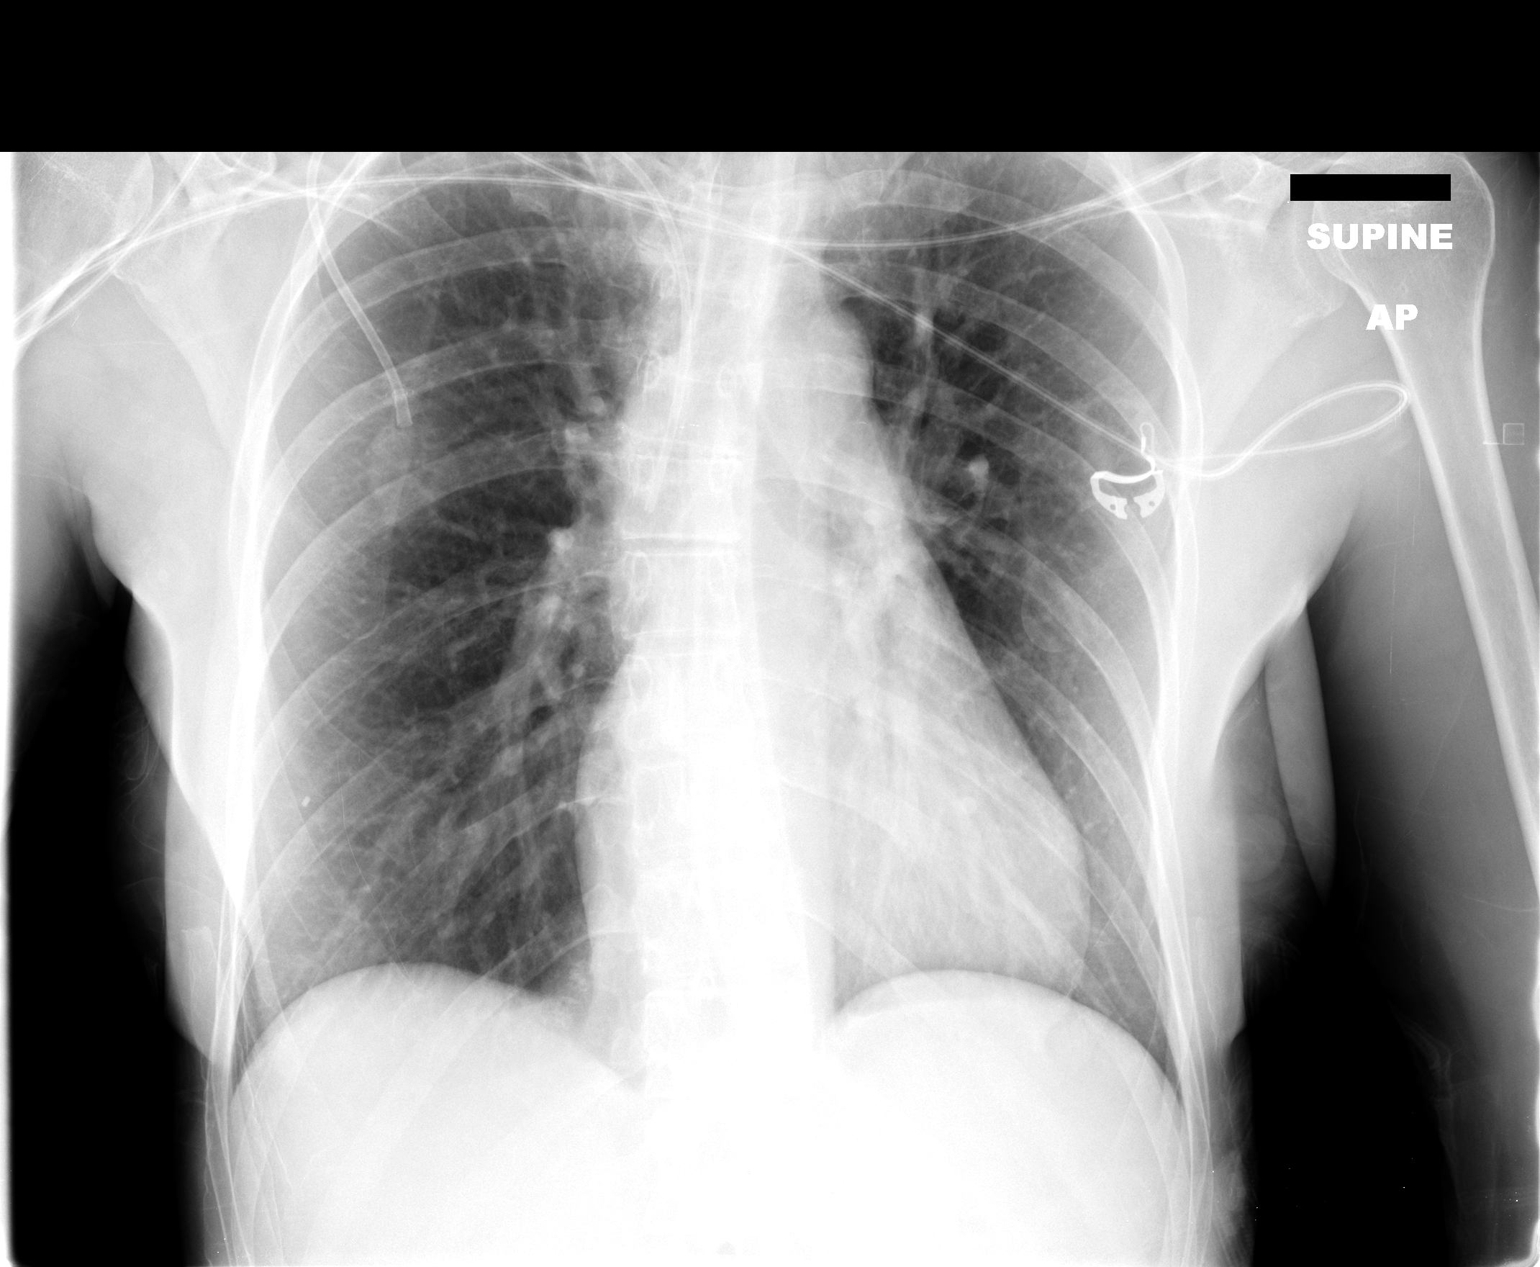

[1 of 1 positions shown; findings below may reference images not displayed]

FINDINGS: Right subclavian Port-A-Cath tip in the mid to upper SVC.
No evidence of pneumothorax or mediastinal hematoma.
Cardiomediastinal silhouette unremarkable for technique.  Lungs
clear.  No visible pleural effusions.
IMPRESSION: 1.  Right subclavian Port-A-Cath tip in the mid to upper SVC.  No
acute complicating features.
2.  No acute cardiopulmonary disease.

Results were discussed directly with Dr. Felner at the time of
interpretation on 01/08/2010 at 3636 hours.

## 2010-12-28 ENCOUNTER — Ambulatory Visit: Payer: BC Managed Care – PPO | Attending: Radiation Oncology | Admitting: Radiation Oncology

## 2010-12-31 ENCOUNTER — Ambulatory Visit: Payer: BC Managed Care – PPO | Attending: Oncology | Admitting: Physical Therapy

## 2010-12-31 DIAGNOSIS — M24519 Contracture, unspecified shoulder: Secondary | ICD-10-CM | POA: Insufficient documentation

## 2010-12-31 DIAGNOSIS — I890 Lymphedema, not elsewhere classified: Secondary | ICD-10-CM | POA: Insufficient documentation

## 2010-12-31 DIAGNOSIS — M25519 Pain in unspecified shoulder: Secondary | ICD-10-CM | POA: Insufficient documentation

## 2010-12-31 DIAGNOSIS — IMO0001 Reserved for inherently not codable concepts without codable children: Secondary | ICD-10-CM | POA: Insufficient documentation

## 2011-01-11 ENCOUNTER — Ambulatory Visit: Payer: BC Managed Care – PPO | Attending: Radiation Oncology | Admitting: Radiation Oncology

## 2011-01-14 ENCOUNTER — Ambulatory Visit: Payer: BC Managed Care – PPO | Admitting: Physical Therapy

## 2011-01-28 ENCOUNTER — Encounter: Payer: BC Managed Care – PPO | Admitting: Physical Therapy

## 2011-06-12 ENCOUNTER — Encounter (HOSPITAL_BASED_OUTPATIENT_CLINIC_OR_DEPARTMENT_OTHER): Payer: BC Managed Care – PPO | Admitting: Oncology

## 2011-06-12 ENCOUNTER — Other Ambulatory Visit: Payer: Self-pay | Admitting: Oncology

## 2011-06-12 DIAGNOSIS — C50219 Malignant neoplasm of upper-inner quadrant of unspecified female breast: Secondary | ICD-10-CM

## 2011-06-12 DIAGNOSIS — G609 Hereditary and idiopathic neuropathy, unspecified: Secondary | ICD-10-CM

## 2011-06-12 DIAGNOSIS — D649 Anemia, unspecified: Secondary | ICD-10-CM

## 2011-06-12 LAB — CBC WITH DIFFERENTIAL/PLATELET
Basophils Absolute: 0 10*3/uL (ref 0.0–0.1)
Eosinophils Absolute: 0 10*3/uL (ref 0.0–0.5)
HCT: 38.2 % (ref 34.8–46.6)
LYMPH%: 15.3 % (ref 14.0–49.7)
MCV: 94.3 fL (ref 79.5–101.0)
MONO#: 0.4 10*3/uL (ref 0.1–0.9)
NEUT#: 3.8 10*3/uL (ref 1.5–6.5)
NEUT%: 75.6 % (ref 38.4–76.8)
Platelets: 202 10*3/uL (ref 145–400)
WBC: 5 10*3/uL (ref 3.9–10.3)

## 2011-06-13 LAB — COMPREHENSIVE METABOLIC PANEL
ALT: 19 U/L (ref 0–35)
AST: 22 U/L (ref 0–37)
Alkaline Phosphatase: 49 U/L (ref 39–117)
Calcium: 9.6 mg/dL (ref 8.4–10.5)
Chloride: 103 mEq/L (ref 96–112)
Creatinine, Ser: 0.68 mg/dL (ref 0.50–1.10)

## 2011-06-21 LAB — ESTRADIOL, ULTRA SENS: Estradiol, Ultra Sensitive: <2 pg/mL

## 2011-07-18 ENCOUNTER — Ambulatory Visit
Admission: RE | Admit: 2011-07-18 | Discharge: 2011-07-18 | Disposition: A | Discharge status: Home / Self Care | Payer: BC Managed Care – PPO | Source: Ambulatory Visit | Attending: Radiation Oncology | Admitting: Radiation Oncology

## 2011-08-09 ENCOUNTER — Encounter (HOSPITAL_BASED_OUTPATIENT_CLINIC_OR_DEPARTMENT_OTHER): Payer: Self-pay | Admitting: *Deleted

## 2011-08-13 ENCOUNTER — Encounter (HOSPITAL_BASED_OUTPATIENT_CLINIC_OR_DEPARTMENT_OTHER)
Admission: RE | Admit: 2011-08-13 | Discharge: 2011-08-13 | Disposition: A | Discharge status: Home / Self Care | Payer: BC Managed Care – PPO | Source: Ambulatory Visit | Attending: Plastic Surgery | Admitting: Plastic Surgery

## 2011-08-13 ENCOUNTER — Other Ambulatory Visit: Payer: Self-pay

## 2011-08-15 ENCOUNTER — Encounter (HOSPITAL_BASED_OUTPATIENT_CLINIC_OR_DEPARTMENT_OTHER): Payer: Self-pay | Admitting: Plastic Surgery

## 2011-08-15 ENCOUNTER — Other Ambulatory Visit: Payer: Self-pay | Admitting: Plastic Surgery

## 2011-08-15 ENCOUNTER — Encounter (HOSPITAL_BASED_OUTPATIENT_CLINIC_OR_DEPARTMENT_OTHER): Payer: Self-pay | Admitting: Anesthesiology

## 2011-08-15 ENCOUNTER — Ambulatory Visit (HOSPITAL_BASED_OUTPATIENT_CLINIC_OR_DEPARTMENT_OTHER): Payer: BC Managed Care – PPO | Admitting: Anesthesiology

## 2011-08-15 ENCOUNTER — Encounter (HOSPITAL_BASED_OUTPATIENT_CLINIC_OR_DEPARTMENT_OTHER): Payer: Self-pay | Admitting: *Deleted

## 2011-08-15 ENCOUNTER — Ambulatory Visit (HOSPITAL_BASED_OUTPATIENT_CLINIC_OR_DEPARTMENT_OTHER)
Admission: RE | Admit: 2011-08-15 | Discharge: 2011-08-15 | Disposition: A | Discharge status: Home / Self Care | Payer: BC Managed Care – PPO | Source: Ambulatory Visit | Attending: Plastic Surgery | Admitting: Plastic Surgery

## 2011-08-15 ENCOUNTER — Encounter (HOSPITAL_BASED_OUTPATIENT_CLINIC_OR_DEPARTMENT_OTHER)
Admission: RE | Disposition: A | Discharge status: Home / Self Care | Payer: Self-pay | Source: Ambulatory Visit | Attending: Plastic Surgery

## 2011-08-15 DIAGNOSIS — C50911 Malignant neoplasm of unspecified site of right female breast: Secondary | ICD-10-CM

## 2011-08-15 DIAGNOSIS — Z421 Encounter for breast reconstruction following mastectomy: Secondary | ICD-10-CM | POA: Insufficient documentation

## 2011-08-15 DIAGNOSIS — Z01810 Encounter for preprocedural cardiovascular examination: Secondary | ICD-10-CM | POA: Insufficient documentation

## 2011-08-15 DIAGNOSIS — Z9889 Other specified postprocedural states: Secondary | ICD-10-CM

## 2011-08-15 HISTORY — PX: BREAST RECONSTRUCTION: SHX9

## 2011-08-15 HISTORY — DX: Malignant (primary) neoplasm, unspecified: C80.1

## 2011-08-15 HISTORY — DX: Gastro-esophageal reflux disease without esophagitis: K21.9

## 2011-08-15 SURGERY — RECONSTRUCTION, BREAST
Anesthesia: Monitor Anesthesia Care | Laterality: Bilateral

## 2011-08-15 SURGERY — Surgical Case
Anesthesia: *Unknown

## 2011-08-15 MED ORDER — ONDANSETRON HCL 4 MG/2ML IJ SOLN: 4.0000 mg | Freq: Once | INTRAMUSCULAR | Status: DC | PRN

## 2011-08-15 MED ORDER — DIAZEPAM 2 MG PO TABS: 2.0000 mg | ORAL_TABLET | Freq: Two times a day (BID) | ORAL | Status: AC | PRN

## 2011-08-15 MED ORDER — DEXAMETHASONE SODIUM PHOSPHATE 4 MG/ML IJ SOLN
INTRAMUSCULAR | Status: DC | PRN
  Administered 2011-08-15: 10 mg via INTRAVENOUS

## 2011-08-15 MED ORDER — SODIUM CHLORIDE 0.9 % IR SOLN: Status: DC | PRN

## 2011-08-15 MED ORDER — LIDOCAINE HCL (CARDIAC) 20 MG/ML IV SOLN
INTRAVENOUS | Status: DC | PRN
  Administered 2011-08-15: 75 mg via INTRAVENOUS

## 2011-08-15 MED ORDER — CEPHALEXIN 500 MG PO CAPS: 500.0000 mg | ORAL_CAPSULE | Freq: Four times a day (QID) | ORAL | Status: AC

## 2011-08-15 MED ORDER — LACTATED RINGERS IV SOLN
INTRAVENOUS | Status: DC
  Administered 2011-08-15 (×3): via INTRAVENOUS

## 2011-08-15 MED ORDER — HYDROCODONE-ACETAMINOPHEN 5-500 MG PO TABS: 1.0000 | ORAL_TABLET | Freq: Four times a day (QID) | ORAL | Status: AC | PRN

## 2011-08-15 MED ORDER — EPHEDRINE SULFATE 50 MG/ML IJ SOLN
INTRAMUSCULAR | Status: DC | PRN
  Administered 2011-08-15 (×2): 10 mg via INTRAVENOUS

## 2011-08-15 MED ORDER — ONDANSETRON HCL 4 MG/2ML IJ SOLN
INTRAMUSCULAR | Status: DC | PRN
  Administered 2011-08-15: 4 mg via INTRAVENOUS

## 2011-08-15 MED ORDER — SODIUM CHLORIDE 0.9 % IR SOLN
Status: DC | PRN
  Administered 2011-08-15: 11:00:00

## 2011-08-15 MED ORDER — DROPERIDOL 2.5 MG/ML IJ SOLN
INTRAMUSCULAR | Status: DC | PRN
  Administered 2011-08-15: 0.625 mg via INTRAVENOUS

## 2011-08-15 MED ORDER — FENTANYL CITRATE 0.05 MG/ML IJ SOLN
INTRAMUSCULAR | Status: DC | PRN
  Administered 2011-08-15: 50 ug via INTRAVENOUS
  Administered 2011-08-15: 25 ug via INTRAVENOUS
  Administered 2011-08-15 (×4): 50 ug via INTRAVENOUS
  Administered 2011-08-15: 25 ug via INTRAVENOUS

## 2011-08-15 MED ORDER — MEPERIDINE HCL 25 MG/ML IJ SOLN: 6.2500 mg | INTRAMUSCULAR | Status: DC | PRN

## 2011-08-15 MED ORDER — PROMETHAZINE HCL 12.5 MG PO TABS: 12.5000 mg | ORAL_TABLET | Freq: Four times a day (QID) | ORAL | Status: AC | PRN

## 2011-08-15 MED ORDER — SODIUM CHLORIDE 0.9 % IR SOLN
Status: DC | PRN
  Administered 2011-08-15: 09:00:00

## 2011-08-15 MED ORDER — HYDROMORPHONE HCL PF 1 MG/ML IJ SOLN
0.2500 mg | INTRAMUSCULAR | Status: DC | PRN
  Administered 2011-08-15 (×3): 0.5 mg via INTRAVENOUS

## 2011-08-15 MED ORDER — MIDAZOLAM HCL 5 MG/5ML IJ SOLN
INTRAMUSCULAR | Status: DC | PRN
  Administered 2011-08-15 (×2): 1 mg via INTRAVENOUS

## 2011-08-15 MED ORDER — PROPOFOL 10 MG/ML IV EMUL
INTRAVENOUS | Status: DC | PRN
  Administered 2011-08-15: 200 mg via INTRAVENOUS

## 2011-08-15 MED ORDER — LACTATED RINGERS IV SOLN: INTRAVENOUS | Status: DC

## 2011-08-15 MED ORDER — CEFAZOLIN SODIUM 1-5 GM-% IV SOLN
1.0000 g | INTRAVENOUS | Status: AC
  Administered 2011-08-15: 1 g via INTRAVENOUS

## 2011-08-15 SURGICAL SUPPLY — 63 items
BAG DECANTER FOR FLEXI CONT (MISCELLANEOUS) ×4 IMPLANT
BANDAGE GAUZE ELAST BULKY 4 IN (GAUZE/BANDAGES/DRESSINGS) ×4 IMPLANT
BINDER BREAST LRG (GAUZE/BANDAGES/DRESSINGS) IMPLANT
BINDER BREAST MEDIUM (GAUZE/BANDAGES/DRESSINGS) ×2 IMPLANT
BINDER BREAST XLRG (GAUZE/BANDAGES/DRESSINGS) IMPLANT
BINDER BREAST XXLRG (GAUZE/BANDAGES/DRESSINGS) IMPLANT
BLADE HEX COATED 2.75 (ELECTRODE) ×2 IMPLANT
BLADE SURG 15 STRL LF DISP TIS (BLADE) ×2 IMPLANT
BLADE SURG 15 STRL SS (BLADE) ×2
CANISTER SUCTION 1200CC (MISCELLANEOUS) IMPLANT
CHLORAPREP W/TINT 26ML (MISCELLANEOUS) ×2 IMPLANT
CLOTH BEACON ORANGE TIMEOUT ST (SAFETY) ×2 IMPLANT
COVER MAYO STAND STRL (DRAPES) ×2 IMPLANT
COVER TABLE BACK 60X90 (DRAPES) ×2 IMPLANT
DECANTER SPIKE VIAL GLASS SM (MISCELLANEOUS) IMPLANT
DERMABOND ADVANCED (GAUZE/BANDAGES/DRESSINGS) ×3
DERMABOND ADVANCED .7 DNX12 (GAUZE/BANDAGES/DRESSINGS) ×3 IMPLANT
DRAIN CHANNEL 19F RND (DRAIN) IMPLANT
DRAPE LAPAROSCOPIC ABDOMINAL (DRAPES) ×2 IMPLANT
DRSG PAD ABDOMINAL 8X10 ST (GAUZE/BANDAGES/DRESSINGS) ×2 IMPLANT
ELECT BLADE 4.0 EZ CLEAN MEGAD (MISCELLANEOUS)
ELECT BLADE 6.5 .24CM SHAFT (ELECTRODE) IMPLANT
ELECT REM PT RETURN 9FT ADLT (ELECTROSURGICAL) ×2
ELECTRODE BLDE 4.0 EZ CLN MEGD (MISCELLANEOUS) IMPLANT
ELECTRODE REM PT RTRN 9FT ADLT (ELECTROSURGICAL) ×1 IMPLANT
EVACUATOR SILICONE 100CC (DRAIN) IMPLANT
GAUZE SPONGE 4X4 12PLY STRL LF (GAUZE/BANDAGES/DRESSINGS) IMPLANT
GLOVE BIO SURGEON STRL SZ 6.5 (GLOVE) ×6 IMPLANT
GOWN PREVENTION PLUS XLARGE (GOWN DISPOSABLE) ×4 IMPLANT
IMPLANT BREAST SILICONE 375CC (Breast) ×4 IMPLANT
IV NS 500ML (IV SOLUTION) ×1
IV NS 500ML BAXH (IV SOLUTION) ×1 IMPLANT
KIT FILL MCGHAN 30CC (MISCELLANEOUS) IMPLANT
NDL SAFETY ECLIPSE 18X1.5 (NEEDLE) ×1 IMPLANT
NEEDLE HYPO 18GX1.5 SHARP (NEEDLE) ×1
NEEDLE HYPO 25X1 1.5 SAFETY (NEEDLE) IMPLANT
NS IRRIG 1000ML POUR BTL (IV SOLUTION) IMPLANT
PACK BASIN DAY SURGERY FS (CUSTOM PROCEDURE TRAY) ×2 IMPLANT
PENCIL BUTTON HOLSTER BLD 10FT (ELECTRODE) ×2 IMPLANT
PIN SAFETY STERILE (MISCELLANEOUS) IMPLANT
SLEEVE SCD COMPRESS KNEE MED (MISCELLANEOUS) ×2 IMPLANT
SPONGE GAUZE 4X4 12PLY (GAUZE/BANDAGES/DRESSINGS) IMPLANT
SPONGE LAP 18X18 X RAY DECT (DISPOSABLE) ×4 IMPLANT
STAPLER VISISTAT 35W (STAPLE) IMPLANT
STRIP CLOSURE SKIN 1/2X4 (GAUZE/BANDAGES/DRESSINGS) IMPLANT
SUT MNCRL AB 3-0 PS2 18 (SUTURE) IMPLANT
SUT MON AB 5-0 PS2 18 (SUTURE) ×4 IMPLANT
SUT PDS AB 2-0 CT2 27 (SUTURE) ×6 IMPLANT
SUT VIC AB 3-0 FS2 27 (SUTURE) IMPLANT
SUT VIC AB 3-0 PS1 18 (SUTURE)
SUT VIC AB 3-0 PS1 18XBRD (SUTURE) IMPLANT
SUT VIC AB 3-0 SH 27 (SUTURE) ×2
SUT VIC AB 3-0 SH 27X BRD (SUTURE) ×2 IMPLANT
SUT VIC AB 5-0 PS2 18 (SUTURE) IMPLANT
SUT VICRYL 4-0 PS2 18IN ABS (SUTURE) ×4 IMPLANT
SYR 50ML LL SCALE MARK (SYRINGE) IMPLANT
SYR BULB IRRIGATION 50ML (SYRINGE) ×2 IMPLANT
SYR CONTROL 10ML LL (SYRINGE) IMPLANT
TOWEL OR 17X24 6PK STRL BLUE (TOWEL DISPOSABLE) ×6 IMPLANT
TUBE CONNECTING 20X1/4 (TUBING) ×2 IMPLANT
UNDERPAD 30X30 INCONTINENT (UNDERPADS AND DIAPERS) ×4 IMPLANT
WATER STERILE IRR 1000ML POUR (IV SOLUTION) IMPLANT
YANKAUER SUCT BULB TIP NO VENT (SUCTIONS) ×2 IMPLANT

## 2011-08-15 NOTE — H&P (Signed)
History and Physical  Sandra [name]   07/30/2011 3:30 PM Office Visit  MRN: [id number]   Provider: Wayland Denis, DO  Department: Plastic Surgery     Diagnoses  -  CA - breast cancer   -      Reason for Visit  -  Pre-op Exam  history and physical       All Notes       Scanned Document signed by Default Authenticator Edi at 08/14/11 1339      Author: Default Authenticator Edi Service: (none) Author Type: (none)    Filed: 08/14/11 1339 Note Time: 07/31/11 1424 Trans ID: [id number]    Trans Status: Available           Subjective:    Patient ID: Sandra [name] is a 55 y.o. female.  HPI Sandra [name] is a 55 yrs old wf here for a post operative follow up on her bilateral breast reconstruction with expander and alloderm placement 06/27/10 and left axillary node dissection (08/06/10). She was diagnosed with bilateral breast cancer in 3/11(invasive ductal cancer (ER/PR +). She finished neoadjuvant chemotherapy 03/2010. She has not had any fevers or other illness since surgery. Radiation to the left finished 11/01/2010. Incision is intact and no sign of infection or skin breakdown. The right skin is very lax. The left is still slightly tight but not as tight as last exam. She is pleased with her current size but likely slightly smaller due to the radiation on the left  The following portions of the patient's history were reviewed and updated as appropriate: allergies, current medications, past family history, past medical history, past social history, past surgical history and problem list.  Review of Systems  Constitutional: Negative.   HENT: Negative.   Eyes: Negative.   Respiratory: Negative.   Cardiovascular: Negative.   Gastrointestinal: Negative.   Genitourinary: Negative.   Musculoskeletal: Negative.   Skin: Negative.   Neurological: Negative.   Hematological: Negative.   Psychiatric/Behavioral: Negative.      Objective:   Physical Exam  Constitutional: She is oriented to person, place,  and time. She appears well-developed and well-nourished.  HENT:   Head: Normocephalic and atraumatic.  Right Ear: External ear normal.  Left Ear: External ear normal.  Eyes: Conjunctivae are normal. Pupils are equal, round, and reactive to light.  Neck: Normal range of motion.  Cardiovascular: Normal rate.   Pulmonary/Chest: Breath sounds normal.  Abdominal: Soft. She exhibits no distension. There is no tenderness.  Musculoskeletal: Normal range of motion.  Neurological: She is alert and oriented to person, place, and time.  Skin: Skin is warm and dry.  Psychiatric: She has a normal mood and affect. Her behavior is normal. Judgment and thought content normal.  Well healed mastectomy scars.    Assessment:  1.  CA - breast cancer        Plan:  The patient currently has 400/600 cc in the right expander and 460/600 in the left expander. We plan to remove the expanders and place silicone implants. Risks and complications reviewed especially with the radiation to the left side which include capsule contracture, infection, exposure of implant, need for revision, loss of implant, distortion, skin loss, seroma, and hematoma.     Follow-up and Disposition     Return in about 3 weeks (around 08/20/2011).         Vitals - Last Recorded       BP Pulse Temp Ht Wt BMI    109/66  69  98.8 F (37.1 C)  1.727 m (5\' 8" )  67.132 kg (148 lb)  22.50 kg/m2

## 2011-08-15 NOTE — Anesthesia Postprocedure Evaluation (Signed)
  Anesthesia Post-op Note  Patient: Sandra [name]  Procedure(s) Performed:  BREAST RECONSTRUCTION - bilateral breast implant exchange; BREAST CAPSULECTOMY WITH IMPLANT EXCHANGE  Patient Location: PACU  Anesthesia Type: General  Level of Consciousness: awake, alert  and oriented  Airway and Oxygen Therapy: Patient Spontanous Breathing  Post-op Pain: mild  Post-op Assessment: Post-op Vital signs reviewed and Patient's Cardiovascular Status Stable  Post-op Vital Signs: stable  Complications: No apparent anesthesia complications

## 2011-08-15 NOTE — Transfer of Care (Signed)
Immediate Anesthesia Transfer of Care Note  Patient: Sandra [name]  Procedure(s) Performed:  BREAST RECONSTRUCTION - bilateral breast implant exchange; BREAST CAPSULECTOMY WITH IMPLANT EXCHANGE  Patient Location: PACU  Anesthesia Type: General  Level of Consciousness: awake and oriented  Airway & Oxygen Therapy: Patient Spontanous Breathing and Patient connected to face mask oxygen  Post-op Assessment: Report given to PACU RN  Post vital signs: stable  Complications: No apparent anesthesia complications

## 2011-08-15 NOTE — H&P (Signed)
  History and Physical  [name]   07/30/2011 3:30 PM Office Visit  MRN: [id number]  Provider: Wayland Denis, DO  Department: Plastic Surgery     Diagnoses  -  CA - breast cancer   -   Reason for Visit  -  Pre-op Exam  history and physical    Subjective:    Patient ID: Sandra [name] is a 55 y.o. female.  HPI Sandra [name] is a 55 yrs old wf here for a post operative follow up on her bilateral breast reconstruction with expander and alloderm placement 06/27/10 and left axillary node dissection (08/06/10). She was diagnosed with bilateral breast cancer in 3/11(invasive ductal cancer (ER/PR +). She finished neoadjuvant chemotherapy 03/2010. She has not had any fevers or other illness since surgery. Radiation to the left finished 11/01/2010. Incision is intact and no sign of infection or skin breakdown. The right skin is very lax. The left is still slightly tight but not as tight as last exam. She is pleased with her current size but likely slightly smaller due to the radiation on the left  The following portions of the patient's history were reviewed and updated as appropriate: allergies, current medications, past family history, past medical history, past social history, past surgical history and problem list.  Review of Systems  Constitutional: Negative.   HENT: Negative.   Eyes: Negative.   Respiratory: Negative.   Cardiovascular: Negative.   Gastrointestinal: Negative.   Genitourinary: Negative.   Musculoskeletal: Negative.   Skin: Negative.   Neurological: Negative.   Hematological: Negative.   Psychiatric/Behavioral: Negative.      Objective:   Physical Exam  Constitutional: She is oriented to person, place, and time. She appears well-developed and well-nourished.  HENT:   Head: Normocephalic and atraumatic.  Right Ear: External ear normal.  Left Ear: External ear normal.  Eyes: Conjunctivae are normal. Pupils are equal, round, and reactive to light.  Neck: Normal range of  motion.  Cardiovascular: Normal rate.   Pulmonary/Chest: Breath sounds normal.  Abdominal: Soft. She exhibits no distension. There is no tenderness.  Musculoskeletal: Normal range of motion.  Neurological: She is alert and oriented to person, place, and time.  Skin: Skin is warm and dry.  Psychiatric: She has a normal mood and affect. Her behavior is normal. Judgment and thought content normal.  Well healed mastectomy scars.    Assessment:  1.  CA - breast cancer        Plan:  The patient currently has 400/600 cc in the right expander and 460/600 in the left expander. We plan to remove the expanders and place of silicone implants. Risks and complications reviewed especially with the radiation to the left side. We discussed the options for size again and agree to stay around 400cc on both sides.  The exam was confirmed and no change in exam, ROS, Social history or medications.     Follow-up and Disposition     Return in about 3 weeks (around 08/20/2011).        Vitals - Last Recorded       BP Pulse Temp Ht Wt BMI from Oct visit.   109/66  69  98.8 F (37.1 C)  1.727 m (5\' 8" )  67.132 kg (148 lb)  22.50 kg/m2

## 2011-08-15 NOTE — Brief Op Note (Signed)
08/15/2011  10:06 AM  PATIENT:  Sandra [name]  55 y.o. female  PRE-OPERATIVE DIAGNOSIS:  Breast cancer [174.9]  POST-OPERATIVE DIAGNOSIS:  S/P Left Breast Cancer w/ Breast Reconstruction  PROCEDURE:  Procedure(s): BILATERAL BREAST RECONSTRUCTION  BILATERAL BREAST CAPSULECTOMY WITH EXPANDER REMOVAL AND IMPLANT PLACEMENT  SURGEON:  Surgeon(s): [employer], DO  PHYSICIAN ASSISTANT:   ASSISTANTS: none   ANESTHESIA:   general  EBL:  Total I/O In: 1100 [I.V.:1100] Out: -   BLOOD ADMINISTERED:none  DRAINS: none   LOCAL MEDICATIONS USED:  NONE  SPECIMEN:  Excision  DISPOSITION OF SPECIMEN:  PATHOLOGY  COUNTS:  YES  TOURNIQUET:  * No tourniquets in log *  DICTATION: .Note written in EPIC  PLAN OF CARE: Discharge to home after PACU  PATIENT DISPOSITION:  PACU - hemodynamically stable.   Delay start of Pharmacological VTE agent (>24hrs) due to surgical blood loss or risk of bleeding: NO

## 2011-08-15 NOTE — Anesthesia Preprocedure Evaluation (Addendum)
Anesthesia Evaluation  Patient identified by MRN, date of birth, ID band Patient awake    Reviewed: Allergy & Precautions, H&P , NPO status , Patient's Chart, lab work & pertinent test results  Airway       Dental   Pulmonary neg pulmonary ROS,          Cardiovascular neg cardio ROS     Neuro/Psych    GI/Hepatic   Endo/Other    Renal/GU      Musculoskeletal   Abdominal   Peds  Hematology   Anesthesia Other Findings   Reproductive/Obstetrics                           Anesthesia Physical Anesthesia Plan  ASA: II  Anesthesia Plan: MAC   Post-op Pain Management:    Induction: Intravenous  Airway Management Planned: LMA  Additional Equipment:   Intra-op Plan:   Post-operative Plan:   Informed Consent: I have reviewed the patients History and Physical, chart, labs and discussed the procedure including the risks, benefits and alternatives for the proposed anesthesia with the patient or authorized representative who has indicated his/her understanding and acceptance.   Dental advisory given  Plan Discussed with: CRNA and Surgeon  Anesthesia Plan Comments:         Anesthesia Quick Evaluation

## 2011-08-15 NOTE — Anesthesia Procedure Notes (Addendum)
Performed by: Zenia Resides D    Procedure Name: LMA Insertion Date/Time: 08/15/2011 7:50 AM Performed by: Zenia Resides D Pre-anesthesia Checklist: Patient identified, Emergency Drugs available, Suction available and Patient being monitored Patient Re-evaluated:Patient Re-evaluated prior to inductionOxygen Delivery Method: Circle System Utilized Preoxygenation: Pre-oxygenation with 100% oxygen Intubation Type: IV induction Ventilation: Mask ventilation without difficulty LMA: LMA with gastric port inserted LMA Size: 4.0 Number of attempts: 1 Placement Confirmation: positive ETCO2 and breath sounds checked- equal and bilateral Tube secured with: Tape Dental Injury: Teeth and Oropharynx as per pre-operative assessment

## 2011-08-15 NOTE — Op Note (Signed)
Op report Bilateral Expander removal and Implant placement   SURGICAL DIVISION: Plastic Surgery  PREOPERATIVE DIAGNOSES: History of breast cancer. S/P mastectomy with expander placement.  POSTOPERATIVE DIAGNOSES:  Same.   PROCEDURE:  1. Bilateral exchange of tissue expanders for implants.  2. Bilateral capsulotomies for implant respositioning.  SURGEON: [employer], DO  ASSISTANT: None.  ANESTHESIA:  General.   COMPLICATIONS: None.   IMPLANTS: Left - Natrelle Silicone filled Smooth Round High Profile Gel 375cc. Ref #[id number].  Serial Number [id number] Right - Natrelle Silicone Filled Smooth Round High Profile Gel 375cc. Ref #[id number].  Serial Number [id number]  INDICATIONS FOR PROCEDURE:  The patient has a history of bilateral mastectomies and had tissue expanders and Alloderm placed at the time of mastectomies. She now presents for exchange of her expanders for implants.  She requires capsulotomies to better position the implants.   CONSENT:  Informed consent was obtained directly from the patient. Risks, benefits and alternatives were fully discussed. Specific risks including but not limited to bleeding, infection, hematoma, seroma, scarring, pain, implant infection, implant extrusion, capsular contracture, asymmetry, wound healing problems, and need for further surgery were all discussed. The patient did have an ample opportunity to have her questions answered to her satisfaction.   DESCRIPTION OF PROCEDURE:  The patient was taken to the operating room. SCDs were placed and IV antibiotics were given. The patient's chest was prepped and draped in a sterile fashion. A time out was performed and the sizer to be used was identified. The old mastectomy scar was excised and sent to pathology.  The bovie was used to open the capsule and alloderm to expose the expander. The expander was evacuated of the fluid and removed.  Inspection of the pocket showed a normal healthy capsule and good  integration of the biologic matrix. Superior capsulotomies were performed on each breast to allow for breast pocket expansion on either side.  The capsules were marked and sent to pathology. Lateral and inferior capsulectomies were performed to loosen the pocket and gain a tissue envelope.  Measurements were made on either side to confirm adequate pocket size for the implant dimensions. The 400cc sizer was used and was too tight on the left side. Hemostasis was ensured.  On either side, the pocket was irrigated with antibiotic solution and gloves were changed. The Natrelle 375 silicone implants were placed in the pockets and oriented appropriately. The capsule and edge of the pectoralis major muscle were re-closed with a 3-0 running Vicryl suture. The remaining skin was closed with 4-0 Vicryl deep dermal and a 5-0 Monocryl subcuticular running suture.  Dermabond and sterile dressings were applied.  A breast binder was placed. The patient was awakened from anesthesia and taken to the recovery room in satisfactory condition.   I was present for the entire surgical case.

## 2011-08-20 ENCOUNTER — Encounter (HOSPITAL_BASED_OUTPATIENT_CLINIC_OR_DEPARTMENT_OTHER): Payer: Self-pay | Admitting: Plastic Surgery

## 2011-10-04 ENCOUNTER — Other Ambulatory Visit: Payer: Self-pay | Admitting: Plastic Surgery

## 2011-10-04 ENCOUNTER — Encounter (HOSPITAL_BASED_OUTPATIENT_CLINIC_OR_DEPARTMENT_OTHER): Payer: Self-pay | Admitting: Anesthesiology

## 2011-10-04 ENCOUNTER — Encounter (HOSPITAL_BASED_OUTPATIENT_CLINIC_OR_DEPARTMENT_OTHER): Payer: Self-pay | Admitting: *Deleted

## 2011-10-04 ENCOUNTER — Encounter (HOSPITAL_BASED_OUTPATIENT_CLINIC_OR_DEPARTMENT_OTHER)
Admission: RE | Disposition: A | Discharge status: Home / Self Care | Payer: Self-pay | Source: Ambulatory Visit | Attending: Plastic Surgery

## 2011-10-04 ENCOUNTER — Ambulatory Visit (HOSPITAL_BASED_OUTPATIENT_CLINIC_OR_DEPARTMENT_OTHER): Payer: BC Managed Care – PPO | Admitting: Anesthesiology

## 2011-10-04 ENCOUNTER — Ambulatory Visit (HOSPITAL_BASED_OUTPATIENT_CLINIC_OR_DEPARTMENT_OTHER)
Admission: RE | Admit: 2011-10-04 | Discharge: 2011-10-04 | Disposition: A | Discharge status: Home / Self Care | Payer: BC Managed Care – PPO | Source: Ambulatory Visit | Attending: Plastic Surgery | Admitting: Plastic Surgery

## 2011-10-04 ENCOUNTER — Encounter (HOSPITAL_BASED_OUTPATIENT_CLINIC_OR_DEPARTMENT_OTHER): Payer: Self-pay | Admitting: Plastic Surgery

## 2011-10-04 DIAGNOSIS — Y849 Medical procedure, unspecified as the cause of abnormal reaction of the patient, or of later complication, without mention of misadventure at the time of the procedure: Secondary | ICD-10-CM | POA: Insufficient documentation

## 2011-10-04 DIAGNOSIS — Z923 Personal history of irradiation: Secondary | ICD-10-CM | POA: Insufficient documentation

## 2011-10-04 DIAGNOSIS — T85898A Other specified complication of other internal prosthetic devices, implants and grafts, initial encounter: Secondary | ICD-10-CM | POA: Insufficient documentation

## 2011-10-04 DIAGNOSIS — Z9889 Other specified postprocedural states: Secondary | ICD-10-CM

## 2011-10-04 DIAGNOSIS — C50919 Malignant neoplasm of unspecified site of unspecified female breast: Secondary | ICD-10-CM | POA: Insufficient documentation

## 2011-10-04 HISTORY — PX: BREAST RECONSTRUCTION: SHX9

## 2011-10-04 LAB — POCT HEMOGLOBIN-HEMACUE: Hemoglobin: 13.7 g/dL (ref 12.0–15.0)

## 2011-10-04 SURGERY — RECONSTRUCTION, BREAST
Anesthesia: General | Site: Breast | Laterality: Left | Wound class: Contaminated

## 2011-10-04 MED ORDER — MIDAZOLAM HCL 2 MG/2ML IJ SOLN: 0.5000 mg | INTRAMUSCULAR | Status: DC | PRN

## 2011-10-04 MED ORDER — DIAZEPAM 2 MG PO TABS: 2.0000 mg | ORAL_TABLET | Freq: Two times a day (BID) | ORAL | Status: AC | PRN

## 2011-10-04 MED ORDER — ONDANSETRON HCL 4 MG/2ML IJ SOLN
INTRAMUSCULAR | Status: DC | PRN
  Administered 2011-10-04: 4 mg via INTRAVENOUS

## 2011-10-04 MED ORDER — SODIUM CHLORIDE 0.9 % IR SOLN
Status: DC | PRN
  Administered 2011-10-04: 12:00:00

## 2011-10-04 MED ORDER — HYDROCODONE-ACETAMINOPHEN 5-325 MG PO TABS: 1.0000 | ORAL_TABLET | Freq: Four times a day (QID) | ORAL | Status: AC | PRN

## 2011-10-04 MED ORDER — CHOLECALCIFEROL 10 MCG (400 UNIT) PO TABS
400.0000 [IU] | ORAL_TABLET | Freq: Every day | ORAL | Status: DC
Start: 2011-10-04 — End: 2011-10-04

## 2011-10-04 MED ORDER — CALCIUM CARBONATE 600 MG PO TABS: 600.0000 mg | ORAL_TABLET | Freq: Every day | ORAL | Status: DC

## 2011-10-04 MED ORDER — DROPERIDOL 2.5 MG/ML IJ SOLN
INTRAMUSCULAR | Status: DC | PRN
  Administered 2011-10-04: 0.625 mg via INTRAVENOUS

## 2011-10-04 MED ORDER — PROPOFOL 10 MG/ML IV EMUL
INTRAVENOUS | Status: DC | PRN
  Administered 2011-10-04: 250 mg via INTRAVENOUS

## 2011-10-04 MED ORDER — FENTANYL CITRATE 0.05 MG/ML IJ SOLN
25.0000 ug | INTRAMUSCULAR | Status: DC | PRN
  Administered 2011-10-04 (×2): 25 ug via INTRAVENOUS

## 2011-10-04 MED ORDER — PANTOPRAZOLE SODIUM 40 MG PO TBEC: 40.0000 mg | DELAYED_RELEASE_TABLET | Freq: Every day | ORAL | Status: DC

## 2011-10-04 MED ORDER — SULFAMETHOXAZOLE-TRIMETHOPRIM 800-160 MG PO TABS: 1.0000 | ORAL_TABLET | Freq: Two times a day (BID) | ORAL | Status: AC

## 2011-10-04 MED ORDER — LIDOCAINE HCL (CARDIAC) 20 MG/ML IV SOLN
INTRAVENOUS | Status: DC | PRN
  Administered 2011-10-04: 100 mg via INTRAVENOUS

## 2011-10-04 MED ORDER — TAMOXIFEN CITRATE 10 MG PO TABS
20.0000 mg | ORAL_TABLET | Freq: Every day | ORAL | Status: DC
Start: 2011-10-04 — End: 2011-10-04

## 2011-10-04 MED ORDER — DEXAMETHASONE SODIUM PHOSPHATE 4 MG/ML IJ SOLN
INTRAMUSCULAR | Status: DC | PRN
  Administered 2011-10-04: 10 mg via INTRAVENOUS

## 2011-10-04 MED ORDER — OMEGA-3 FATTY ACIDS 1000 MG PO CAPS: 2.0000 g | ORAL_CAPSULE | Freq: Every day | ORAL | Status: DC

## 2011-10-04 MED ORDER — MIDAZOLAM HCL 5 MG/5ML IJ SOLN
INTRAMUSCULAR | Status: DC | PRN
  Administered 2011-10-04: 2 mg via INTRAVENOUS

## 2011-10-04 MED ORDER — LACTATED RINGERS IV SOLN
INTRAVENOUS | Status: DC
  Administered 2011-10-04: 10:00:00 via INTRAVENOUS

## 2011-10-04 MED ORDER — ACETAMINOPHEN 10 MG/ML IV SOLN
1000.0000 mg | Freq: Once | INTRAVENOUS | Status: AC
  Administered 2011-10-04: 1000 mg via INTRAVENOUS

## 2011-10-04 MED ORDER — MORPHINE SULFATE 2 MG/ML IJ SOLN: 0.0500 mg/kg | INTRAMUSCULAR | Status: DC | PRN

## 2011-10-04 MED ORDER — FENTANYL CITRATE 0.05 MG/ML IJ SOLN
INTRAMUSCULAR | Status: DC | PRN
  Administered 2011-10-04 (×2): 50 ug via INTRAVENOUS

## 2011-10-04 MED ORDER — CEFAZOLIN SODIUM 1-5 GM-% IV SOLN
INTRAVENOUS | Status: DC | PRN
  Administered 2011-10-04: 1 g via INTRAVENOUS

## 2011-10-04 MED ORDER — METOCLOPRAMIDE HCL 5 MG/ML IJ SOLN: 10.0000 mg | Freq: Once | INTRAMUSCULAR | Status: DC | PRN

## 2011-10-04 SURGICAL SUPPLY — 63 items
BAG DECANTER FOR FLEXI CONT (MISCELLANEOUS) ×2 IMPLANT
BANDAGE GAUZE ELAST BULKY 4 IN (GAUZE/BANDAGES/DRESSINGS) IMPLANT
BINDER BREAST LRG (GAUZE/BANDAGES/DRESSINGS) ×2 IMPLANT
BINDER BREAST MEDIUM (GAUZE/BANDAGES/DRESSINGS) IMPLANT
BINDER BREAST XLRG (GAUZE/BANDAGES/DRESSINGS) IMPLANT
BINDER BREAST XXLRG (GAUZE/BANDAGES/DRESSINGS) IMPLANT
BLADE HEX COATED 2.75 (ELECTRODE) ×2 IMPLANT
BLADE SURG 10 STRL SS (BLADE) ×2 IMPLANT
BLADE SURG 15 STRL LF DISP TIS (BLADE) ×1 IMPLANT
BLADE SURG 15 STRL SS (BLADE) ×1
CANISTER SUCTION 1200CC (MISCELLANEOUS) ×4 IMPLANT
CHLORAPREP W/TINT 26ML (MISCELLANEOUS) ×2 IMPLANT
CLOTH BEACON ORANGE TIMEOUT ST (SAFETY) ×2 IMPLANT
COVER MAYO STAND STRL (DRAPES) ×2 IMPLANT
COVER TABLE BACK 60X90 (DRAPES) ×2 IMPLANT
DECANTER SPIKE VIAL GLASS SM (MISCELLANEOUS) IMPLANT
DERMABOND ADVANCED (GAUZE/BANDAGES/DRESSINGS) ×1
DERMABOND ADVANCED .7 DNX12 (GAUZE/BANDAGES/DRESSINGS) ×1 IMPLANT
DRAIN CHANNEL 19F RND (DRAIN) IMPLANT
DRAPE LAPAROSCOPIC ABDOMINAL (DRAPES) ×2 IMPLANT
DRSG PAD ABDOMINAL 8X10 ST (GAUZE/BANDAGES/DRESSINGS) ×2 IMPLANT
ELECT BLADE 4.0 EZ CLEAN MEGAD (MISCELLANEOUS) ×2
ELECT BLADE 6.5 .24CM SHAFT (ELECTRODE) IMPLANT
ELECT REM PT RETURN 9FT ADLT (ELECTROSURGICAL) ×2
ELECTRODE BLDE 4.0 EZ CLN MEGD (MISCELLANEOUS) ×1 IMPLANT
ELECTRODE REM PT RTRN 9FT ADLT (ELECTROSURGICAL) ×1 IMPLANT
EVACUATOR SILICONE 100CC (DRAIN) IMPLANT
GAUZE SPONGE 4X4 12PLY STRL LF (GAUZE/BANDAGES/DRESSINGS) IMPLANT
GLOVE BIO SURGEON STRL SZ 6.5 (GLOVE) ×6 IMPLANT
GLOVE SKINSENSE NS SZ7.0 (GLOVE) ×1
GLOVE SKINSENSE STRL SZ7.0 (GLOVE) ×1 IMPLANT
GOWN PREVENTION PLUS XLARGE (GOWN DISPOSABLE) ×4 IMPLANT
IMPL GEL 300CC (Breast) ×1 IMPLANT
IMPLANT GEL 300CC (Breast) ×2 IMPLANT
IV NS 1000ML (IV SOLUTION)
IV NS 1000ML BAXH (IV SOLUTION) IMPLANT
IV NS 500ML (IV SOLUTION)
IV NS 500ML BAXH (IV SOLUTION) IMPLANT
KIT FILL SYSTEM UNIVERSAL (SET/KITS/TRAYS/PACK) IMPLANT
MENTOR SMOOTH ROUND HIGH PROFILE GEL BREAST IMPLANT 325CC ×2 IMPLANT
NEEDLE HYPO 25X1 1.5 SAFETY (NEEDLE) IMPLANT
NEEDLE SPNL 22GX3.5 QUINCKE BK (NEEDLE) IMPLANT
NS IRRIG 1000ML POUR BTL (IV SOLUTION) IMPLANT
PACK BASIN DAY SURGERY FS (CUSTOM PROCEDURE TRAY) ×2 IMPLANT
PENCIL BUTTON HOLSTER BLD 10FT (ELECTRODE) ×2 IMPLANT
PIN SAFETY STERILE (MISCELLANEOUS) IMPLANT
SLEEVE SCD COMPRESS KNEE MED (MISCELLANEOUS) ×2 IMPLANT
SPONGE LAP 18X18 X RAY DECT (DISPOSABLE) ×2 IMPLANT
STRIP CLOSURE SKIN 1/2X4 (GAUZE/BANDAGES/DRESSINGS) IMPLANT
SUT MON AB 5-0 PS2 18 (SUTURE) ×2 IMPLANT
SUT PDS AB 2-0 CT2 27 (SUTURE) IMPLANT
SUT VIC AB 3-0 SH 27 (SUTURE) ×2
SUT VIC AB 3-0 SH 27X BRD (SUTURE) ×2 IMPLANT
SUT VICRYL 4-0 PS2 18IN ABS (SUTURE) ×2 IMPLANT
SWAB CULTURE LIQ STUART DBL (MISCELLANEOUS) ×2 IMPLANT
SYR 50ML LL SCALE MARK (SYRINGE) IMPLANT
SYR BULB IRRIGATION 50ML (SYRINGE) ×2 IMPLANT
SYR CONTROL 10ML LL (SYRINGE) IMPLANT
TOWEL OR 17X24 6PK STRL BLUE (TOWEL DISPOSABLE) ×4 IMPLANT
TUBE CONNECTING 20X1/4 (TUBING) ×2 IMPLANT
UNDERPAD 30X30 INCONTINENT (UNDERPADS AND DIAPERS) ×4 IMPLANT
WATER STERILE IRR 1000ML POUR (IV SOLUTION) ×2 IMPLANT
YANKAUER SUCT BULB TIP NO VENT (SUCTIONS) ×2 IMPLANT

## 2011-10-04 NOTE — Anesthesia Postprocedure Evaluation (Signed)
Anesthesia Post Note  Patient: Sandra [name]  Procedure(s) Performed:  BREAST RECONSTRUCTION - removal left breast implant with exchange for new implant  Anesthesia type: General  Patient location: PACU  Post pain: Pain level controlled  Post assessment: Patient's Cardiovascular Status Stable  Last Vitals:  Filed Vitals:   10/04/11 1330  BP: 110/62  Pulse: 68  Temp:   Resp: 14    Post vital signs: Reviewed and stable  Level of consciousness: alert  Complications: No apparent anesthesia complications

## 2011-10-04 NOTE — Transfer of Care (Signed)
Immediate Anesthesia Transfer of Care Note  Patient: Sandra [name]  Procedure(s) Performed:  BREAST RECONSTRUCTION - removal left breast implant with exchange for new implant  Patient Location: PACU  Anesthesia Type: General  Level of Consciousness: awake, alert  and oriented  Airway & Oxygen Therapy: Patient Spontanous Breathing and Patient connected to face mask oxygen  Post-op Assessment: Report given to PACU RN and Post -op Vital signs reviewed and stable  Post vital signs: Reviewed and stable  Complications: No apparent anesthesia complications

## 2011-10-04 NOTE — Brief Op Note (Signed)
10/04/2011  11:56 AM  PATIENT:  Sandra [name]  56 y.o. female  PRE-OPERATIVE DIAGNOSIS:  exposed left breast implant  POST-OPERATIVE DIAGNOSIS:  exposed left breast implant  PROCEDURE:  Procedure(s):BREAST RECONSTRUCTION WITH REMOVAL OF EXPOSED IMPLANT AND PLACEMENT OF SILICONE MENTOR 300CC HIGH PROFILE IMPLANT  SURGEON:  Surgeon(s): [employer], DO  PHYSICIAN ASSISTANT:   ASSISTANTS: none   ANESTHESIA:   general  EBL:  Total I/O In: 400 [I.V.:400] Out: -   BLOOD ADMINISTERED:none  DRAINS: none   LOCAL MEDICATIONS USED:  NONE  SPECIMEN:  Source of Specimen:  incision site of left breast mastectomy scar  DISPOSITION OF SPECIMEN:  PATHOLOGY  COUNTS:  YES  TOURNIQUET:  * No tourniquets in log *  DICTATION: .Other Dictation: Dictation Number DICTATED  PLAN OF CARE: Discharge to home after PACU  PATIENT DISPOSITION:  PACU - hemodynamically stable.   Delay start of Pharmacological VTE agent (>24hrs) due to surgical blood loss or risk of bleeding:  NO

## 2011-10-04 NOTE — Anesthesia Procedure Notes (Signed)
Procedure Name: LMA Insertion Date/Time: 10/04/2011 11:17 AM Performed by: Zenia Resides D Pre-anesthesia Checklist: Patient identified, Emergency Drugs available, Suction available and Patient being monitored Patient Re-evaluated:Patient Re-evaluated prior to inductionOxygen Delivery Method: Circle System Utilized Preoxygenation: Pre-oxygenation with 100% oxygen Intubation Type: IV induction Ventilation: Mask ventilation without difficulty LMA: LMA with gastric port inserted LMA Size: 4.0 Number of attempts: 1 Placement Confirmation: positive ETCO2 and breath sounds checked- equal and bilateral Tube secured with: Tape Dental Injury: Teeth and Oropharynx as per pre-operative assessment

## 2011-10-04 NOTE — Progress Notes (Signed)
Dr Kelly Splinter called and discharge medications reviewed and confirmed with her.  Dr Kelly Splinter is calling in the diazepam and [facility] Rx to Pts pharmacy([facility]-on [address] in [place]) Pt given bactrim Rx with discharge instructions in PACU Phase II.  Dr Kelly Splinter verbalized she will call these in immediately at this time. LBowles/RN

## 2011-10-04 NOTE — H&P (Signed)
History and Physical  Sandra [name]   10/04/2011 8:15 AM Office Visit  MRN: [id number]   Description: 56 year old female  Provider: Wayland Denis, DO  Department:  Plastic Surgery     Diagnoses    CA - breast cancer   - Primary   174.9     Reason for Visit  -  Breast Reconstruction     Subjective:   Patient ID: Sandra [name] is a 56 y.o. female.  HPI Sandra [name] is a 56 yrs old wf here for a post operative follow up on her bilateral breast reconstruction with expander and alloderm placement 06/27/10 and left axillary node dissection (08/06/10). She then had an implant exchange on 08/15/2011. She was diagnosed with bilateral breast cancer in 3/11(invasive ductal cancer (ER/PR +). She finished neoadjuvant chemotherapy 03/2010. She has not had any fevers or other illness since surgery. Radiation to the left finished 11/01/2010. Incision opened up in the night.  There was no sign of infection or fluid collection yesterday.  There was no trauma that she can recall.  She is healthy otherwise and feeling good without any recent illness.   The following portions of the patient's history were reviewed and updated as appropriate: allergies, current medications, past family history, past medical history, past social history, past surgical history and problem list.  Review of Systems  Constitutional: Negative.   HENT: Negative.   Eyes: Negative.   Respiratory: Negative.   Cardiovascular: Negative.   Gastrointestinal: Negative.   Genitourinary: Negative.   Musculoskeletal: Negative.   Neurological: Negative.   Hematological: Negative.   Psychiatric/Behavioral: Negative.       Objective:   Physical Exam  Constitutional: She appears well-developed and well-nourished.  HENT:   Head: Normocephalic and atraumatic.  Eyes: Conjunctivae and EOM are normal.  Neck: Normal range of motion.  Cardiovascular: Normal rate.   Pulmonary/Chest: Effort normal.  Abdominal: Soft. She exhibits no distension. There is  no tenderness.  Neurological: She is alert.  Skin:       Left incision open ~3cm with implant exposed.  Psychiatric: She has a normal mood and affect. Her behavior is normal. Judgment and thought content normal.   Assessment:  1.  CA - breast cancer     Plan:     We had a long discussion about the risks and complications with salvage.  She would like to try to place a smaller implant and salvage the current reconstruction.  We discussed the latissimus flap again and that that is an option.  She seems to be aware of the situation and knew this was a risk. The patient was seen in holding and marked.  Consent confirmed for removal of implant and possible placement of a smaller one.

## 2011-10-04 NOTE — Anesthesia Preprocedure Evaluation (Signed)
Anesthesia Evaluation  Patient identified by MRN, date of birth, ID band Patient awake    Reviewed: Allergy & Precautions, H&P , NPO status , Patient's Chart, lab work & pertinent test results, reviewed documented beta blocker date and time   Airway Mallampati: II TM Distance: >3 FB Neck ROM: full    Dental   Pulmonary neg pulmonary ROS,          Cardiovascular neg cardio ROS     Neuro/Psych Negative Neurological ROS  Negative Psych ROS   GI/Hepatic Neg liver ROS, Medicated and Controlled,  Endo/Other  Negative Endocrine ROS  Renal/GU negative Renal ROS  Genitourinary negative   Musculoskeletal   Abdominal   Peds  Hematology negative hematology ROS (+)   Anesthesia Other Findings See surgeon's H&P   Reproductive/Obstetrics negative OB ROS                           Anesthesia Physical Anesthesia Plan  ASA: II  Anesthesia Plan: General   Post-op Pain Management:    Induction: Intravenous  Airway Management Planned: LMA  Additional Equipment:   Intra-op Plan:   Post-operative Plan: Extubation in OR  Informed Consent: I have reviewed the patients History and Physical, chart, labs and discussed the procedure including the risks, benefits and alternatives for the proposed anesthesia with the patient or authorized representative who has indicated his/her understanding and acceptance.     Plan Discussed with: CRNA and Surgeon  Anesthesia Plan Comments:         Anesthesia Quick Evaluation

## 2011-10-06 LAB — WOUND CULTURE

## 2011-10-07 NOTE — Op Note (Signed)
NAME:  Sandra [name], Sandra [name]                      ACCOUNT NO.:  MEDICAL RECORD NO.:  [id number]  LOCATION:                                 FACILITY:  PHYSICIAN:  Wayland Denis, DO           DATE OF BIRTH:  DATE OF PROCEDURE:  10/04/2011 DATE OF DISCHARGE:  10/04/2011                              OPERATIVE REPORT   PREOPERATIVE DIAGNOSIS:  History of breast cancer with reconstruction and exposure of left implant.  POSTOP DIAGNOSIS:  History of breast cancer with reconstruction and exposure of left implant.  PROCEDURE:  Removal of left breast implant with excision of skin and placement of Mentor 300 cc high-profile silicone implant.  ATTENDING SURGEON:  Wayland Denis, DO  ANESTHESIA:  General.  INDICATION FOR PROCEDURE:  [name] is a 57 year old female who had bilateral mastectomies for breast cancer, and unexpectedly underwent radiation on the left after expander placement.  The risks and complications were explained for expansion and continuation of the reconstruction, and the patient wanted to try and salvage implant-based reconstruction, and so she was expanded and exchanged with an implant placed and did well until last this morning.  This morning, she woke up and there was full exposure of the implant.  There was no redness, no draining, no malodor, no sign of infection, and but no indication of it opening yesterday.  She was seen in the office.  Risks and complications were reviewed in detail including the risk of infection, recurrence and she was offered removal with planned latissimus muscle flap reconstruction she wanted to try to still salvage the implant-based reconstruction.  DESCRIPTION OF PROCEDURE:  The patient was seen in holding and marked. Risks, complications were reviewed again.  She was taken to the operating room, placed on the operating room table in supine position. General anesthesia was administered.  Once adequate time-out was called, all information  was confirmed to be correct.  She was prepped and draped in the usual sterile fashion.  The implant was removed.  Gloves were changed, and the pocket was irrigated with copious amounts of normal saline and antibiotic solution.  The skin edges and AlloDerm were excised and a 325 cc expander was placed which was felt to be too tight, therefore 300 cc high profile Mentor silicone smooth implant was placed. The deep layers were closed with 3-0 Vicryl, then a 4-0 Vicryl and a running 5-0 Monocryl was used to close the skin.  Dermabond was applied with an ABD and a breast binder. The patient tolerated the procedure well.  There were no complications. Prior to closure, culture was obtained just for clarity, but at no time did there appear to be an infection.  The patient tolerated the procedure well.  There were no complications.  She was awoken and taken to recovery room in stable condition.     Wayland Denis, DO     CS/MEDQ  D:  10/04/2011  T:  10/05/2011  Job:  [id number]

## 2011-10-10 ENCOUNTER — Encounter (HOSPITAL_BASED_OUTPATIENT_CLINIC_OR_DEPARTMENT_OTHER): Payer: Self-pay | Admitting: Plastic Surgery

## 2011-10-29 ENCOUNTER — Other Ambulatory Visit: Payer: Self-pay | Admitting: *Deleted

## 2011-10-29 DIAGNOSIS — C50219 Malignant neoplasm of upper-inner quadrant of unspecified female breast: Secondary | ICD-10-CM

## 2011-10-29 MED ORDER — ESOMEPRAZOLE MAGNESIUM 40 MG PO CPDR: 40.0000 mg | DELAYED_RELEASE_CAPSULE | Freq: Every day | ORAL | Status: DC

## 2011-12-05 ENCOUNTER — Other Ambulatory Visit: Payer: Self-pay | Admitting: *Deleted

## 2011-12-05 ENCOUNTER — Telehealth: Payer: Self-pay | Admitting: *Deleted

## 2011-12-05 DIAGNOSIS — C50919 Malignant neoplasm of unspecified site of unspecified female breast: Secondary | ICD-10-CM

## 2011-12-05 MED ORDER — TAMOXIFEN CITRATE 10 MG PO TABS: 20.0000 mg | ORAL_TABLET | Freq: Every day | ORAL | Status: DC

## 2011-12-05 NOTE — Telephone Encounter (Signed)
left voice message to inform the patient of the new date and time on 12-09-2011 starting at 3:00pm asked the patient to please call me back and inform me that she did receive the message

## 2011-12-06 ENCOUNTER — Telehealth: Payer: Self-pay | Admitting: *Deleted

## 2011-12-06 NOTE — Telephone Encounter (Signed)
patient called back to confirm her appointment change for 12-09-2011 with dr.rubin

## 2011-12-09 ENCOUNTER — Other Ambulatory Visit (HOSPITAL_BASED_OUTPATIENT_CLINIC_OR_DEPARTMENT_OTHER): Payer: BC Managed Care – PPO | Admitting: Lab

## 2011-12-09 ENCOUNTER — Ambulatory Visit (HOSPITAL_BASED_OUTPATIENT_CLINIC_OR_DEPARTMENT_OTHER): Payer: BC Managed Care – PPO | Admitting: Oncology

## 2011-12-09 ENCOUNTER — Telehealth: Payer: Self-pay | Admitting: *Deleted

## 2011-12-09 DIAGNOSIS — C50219 Malignant neoplasm of upper-inner quadrant of unspecified female breast: Secondary | ICD-10-CM

## 2011-12-09 DIAGNOSIS — D649 Anemia, unspecified: Secondary | ICD-10-CM

## 2011-12-09 DIAGNOSIS — E559 Vitamin D deficiency, unspecified: Secondary | ICD-10-CM

## 2011-12-09 DIAGNOSIS — G609 Hereditary and idiopathic neuropathy, unspecified: Secondary | ICD-10-CM

## 2011-12-09 DIAGNOSIS — C50919 Malignant neoplasm of unspecified site of unspecified female breast: Secondary | ICD-10-CM

## 2011-12-09 LAB — COMPREHENSIVE METABOLIC PANEL
Albumin: 4 g/dL (ref 3.5–5.2)
CO2: 31 mEq/L (ref 19–32)
Calcium: 9.5 mg/dL (ref 8.4–10.5)
Glucose, Bld: 89 mg/dL (ref 70–99)
Potassium: 3.6 mEq/L (ref 3.5–5.3)
Sodium: 141 mEq/L (ref 135–145)
Total Protein: 6.8 g/dL (ref 6.0–8.3)

## 2011-12-09 LAB — CBC WITH DIFFERENTIAL/PLATELET
Eosinophils Absolute: 0.1 10*3/uL (ref 0.0–0.5)
HGB: 12.9 g/dL (ref 11.6–15.9)
LYMPH%: 18.9 % (ref 14.0–49.7)
MONO#: 0.3 10*3/uL (ref 0.1–0.9)
NEUT#: 3.1 10*3/uL (ref 1.5–6.5)
Platelets: 209 10*3/uL (ref 145–400)
RBC: 3.99 10*6/uL (ref 3.70–5.45)
RDW: 13.3 % (ref 11.2–14.5)
WBC: 4.3 10*3/uL (ref 3.9–10.3)

## 2011-12-09 NOTE — Telephone Encounter (Signed)
na

## 2011-12-09 NOTE — Telephone Encounter (Signed)
gave patient appointment for 05-2012 printed out calendar and gave to the patient 

## 2011-12-10 LAB — CANCER ANTIGEN 27.29: CA 27.29: 25 U/mL (ref 0–39)

## 2011-12-10 LAB — VITAMIN D 25 HYDROXY (VIT D DEFICIENCY, FRACTURES): Vit D, 25-Hydroxy: 43 ng/mL (ref 30–89)

## 2011-12-10 NOTE — Progress Notes (Signed)
Hematology and Oncology Follow Up Visit  Sandra [name] [id number] May 07, 1956 56 y.o. 12/10/2011 5:13 PM PCP Dr Salena Saner sanger Principle Diagnosis: History of ER/PR positive, HER-2 positive left breast cancer with concurrent HER-2 negative and ER/PR positive breast cancer in the right breast status post neoadjuvant chemotherapy with 4 cycles of dose dense FEC, 1 cycle of dose dense Taxol with treatment intolerance, switched to Abraxane 3 weeks on, 1 week off status post bilateral mastectomies.  Bilateral tissue expanders 06/27/2010.  Right-sided ypTis, ypN0, ER/PR positive, HER-2 negative breast cancer.  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  Status post radiation therapy completed 11/24/2010 to left breast. Currently on tamoxifen  Interim History:  There have been no intercurrent illness, hospitalizations or medication changes. She did undergo reconstruction in the end of 2012 and has bilateral implants. Unfortunately the implants unless had opened up and this had to be changed after a smaller implant. There is some asymmetry. She feels otherwise pretty well.  Medications: I have reviewed the patient's current medications.  Allergies:  Allergies  Allergen Reactions  . Ibuprofen Nausea And Vomiting and Other (See Comments)    Becomes incoherent    Past Medical History, Surgical history, Social history, and Family History were reviewed and updated.  Review of Systems: Constitutional:  Negative for fever, chills, night sweats, anorexia, weight loss, pain. Cardiovascular: no chest pain or dyspnea on exertion Respiratory: no cough, shortness of breath, or wheezing Neurological: negative Dermatological: negative ENT: negative Skin Gastrointestinal: negative Genito-Urinary: negative Hematological and Lymphatic: negative Breast: She is status post bilateral implants there is asymmetry with the left gland being larger than the right. There is no evidence of skin breakdown inferior portion  of the left implant. Both axilla are negative. Musculoskeletal: negative Remaining ROS negative.  Physical Exam: Blood pressure 114/71, pulse 75, temperature 98.1 F (36.7 C), temperature source Oral, height 5' 8.5" (1.74 m), weight 152 lb 6.4 oz (69.128 kg). ECOG: 0 General appearance: alert, cooperative and appears stated age Head: Normocephalic, without obvious abnormality, atraumatic Neck: no adenopathy, no carotid bruit, no JVD, supple, symmetrical, trachea midline and thyroid not enlarged, symmetric, no tenderness/mass/nodules Lymph nodes: Cervical, supraclavicular, and axillary nodes normal. Cardiac : regular rate and rhythm, no murmurs or gallops Pulmonary:clear to auscultation bilaterally and normal percussion bilaterally Breasts: inspection negative, no nipple discharge or bleeding, no masses or nodularity palpable Abdomen:soft, non-tender; bowel sounds normal; no masses,  no organomegaly Extremities negative Neuro: alert, oriented, normal speech, no focal findings or movement disorder noted  Lab Results: Lab Results  Component Value Date   WBC 4.3 12/09/2011   HGB 12.9 12/09/2011   HCT 37.7 12/09/2011   MCV 94.4 12/09/2011   PLT 209 12/09/2011     Chemistry      Component Value Date/Time   NA 141 12/09/2011 1503   K 3.6 12/09/2011 1503   CL 103 12/09/2011 1503   CO2 31 12/09/2011 1503   BUN 14 12/09/2011 1503   CREATININE 0.69 12/09/2011 1503      Component Value Date/Time   CALCIUM 9.5 12/09/2011 1503   ALKPHOS 52 12/09/2011 1503   AST 25 12/09/2011 1503   ALT 23 12/09/2011 1503   BILITOT 0.2* 12/09/2011 1503      .pathology. Radiological Studies: chest X-ray n/a Mammogram n/a Bone density n/a  Impression and Plan:  Patient is doing well. We discussed implications of recurrent reconstruction. Going forward there maybe further skin breakdown problems the left side given its exposure to radiation. Ultimately  she may benefit from reduction of implant on the right  side to make her look more symmetrical. Regardless I think she is doing fairly well. I reviewed her lab work with her and these all appear to be normal .  More than 50% of the visit was spent in patient-related counselling   Pierce Crane, MD 3/12/20135:13 PM

## 2011-12-10 NOTE — Progress Notes (Deleted)
Hematology and Oncology Follow Up Visit  Sandra [name] [id number] [birth year] 56 y.o. 12/10/2011 3:52 PM PCP  Principle Diagnosis: ***  Interim History:  There have been no intercurrent illness, hospitalizations or medication changes.  Medications: {medication reviewed/display:3041432}  Allergies:  Allergies  Allergen Reactions  . Ibuprofen Nausea And Vomiting and Other (See Comments)    Becomes incoherent    Past Medical History, Surgical history, Social history, and Family History were reviewed and updated.  Review of Systems: Constitutional:  Negative for fever, chills, night sweats, anorexia, weight loss, pain. Cardiovascular: {roscv:310661} Respiratory: {ros resp:310659} Neurological: {rosneuro:310663} Dermatological: {ros skin:310673} ENT: {rosent:310657} Skin Gastrointestinal: {ros gi:310669} Genito-Urinary: {rosgu:310671} Hematological and Lymphatic: {rosheme/lymph:310665} Breast: {ros breast:311036} Musculoskeletal: {ros musculoskeletal:310677} Remaining ROS negative.  Physical Exam: Blood pressure 114/71, pulse 75, temperature 98.1 F (36.7 C), temperature source Oral, height 5' 8.5" (1.74 m), weight 152 lb 6.4 oz (69.128 kg). ECOG:  General appearance: {general exam:16600} Head: {head exam:30909::"Normocephalic, without obvious abnormality","atraumatic"} Neck: {neck exam:17463::"no adenopathy","no carotid bruit","no JVD","supple, symmetrical, trachea midline","thyroid not enlarged, symmetric, no tenderness/mass/nodules"} Lymph nodes: {lymph node exam:14039::"Cervical, supraclavicular, and axillary nodes normal."} Cardiac : {HEART EXAM HEM/ONC:21750} Pulmonary:{Exam; lung:16931} Breasts: {exam; breast female:1202::"inspection negative, no nipple discharge or bleeding, no masses or nodularity palpable"} Abdomen:{Exam; abdomen:16834} Extremities {Exam; extremity:10330} Neuro: {neuro:315902::"alert, oriented, normal speech, no focal findings or movement disorder  noted"}  Lab Results: Lab Results  Component Value Date   WBC 4.3 12/09/2011   HGB 12.9 12/09/2011   HCT 37.7 12/09/2011   MCV 94.4 12/09/2011   PLT 209 12/09/2011     Chemistry      Component Value Date/Time   NA 141 12/09/2011 1503   K 3.6 12/09/2011 1503   CL 103 12/09/2011 1503   CO2 31 12/09/2011 1503   BUN 14 12/09/2011 1503   CREATININE 0.69 12/09/2011 1503      Component Value Date/Time   CALCIUM 9.5 12/09/2011 1503   ALKPHOS 52 12/09/2011 1503   AST 25 12/09/2011 1503   ALT 23 12/09/2011 1503   BILITOT 0.2* 12/09/2011 1503      .pathology. Radiological Studies: chest X-ray *** Mammogram *** Bone density ***  Impression and Plan: ***  More than 50% of the visit was spent in patient-related counselling   Pierce Crane, MD 3/12/20133:52 PM

## 2011-12-13 ENCOUNTER — Other Ambulatory Visit: Payer: BC Managed Care – PPO | Admitting: Lab

## 2011-12-13 ENCOUNTER — Ambulatory Visit: Payer: BC Managed Care – PPO | Admitting: Oncology

## 2011-12-26 ENCOUNTER — Other Ambulatory Visit: Payer: Self-pay | Admitting: *Deleted

## 2011-12-26 DIAGNOSIS — C50919 Malignant neoplasm of unspecified site of unspecified female breast: Secondary | ICD-10-CM

## 2011-12-26 MED ORDER — TAMOXIFEN CITRATE 10 MG PO TABS: 20.0000 mg | ORAL_TABLET | Freq: Every day | ORAL | Status: DC

## 2012-01-21 ENCOUNTER — Other Ambulatory Visit: Payer: Self-pay | Admitting: *Deleted

## 2012-01-21 DIAGNOSIS — C50919 Malignant neoplasm of unspecified site of unspecified female breast: Secondary | ICD-10-CM

## 2012-01-21 MED ORDER — TAMOXIFEN CITRATE 20 MG PO TABS: 20.0000 mg | ORAL_TABLET | Freq: Every day | ORAL | Status: AC

## 2012-06-12 ENCOUNTER — Ambulatory Visit (HOSPITAL_BASED_OUTPATIENT_CLINIC_OR_DEPARTMENT_OTHER): Payer: BC Managed Care – PPO | Admitting: Oncology

## 2012-06-12 ENCOUNTER — Telehealth: Payer: Self-pay | Admitting: *Deleted

## 2012-06-12 ENCOUNTER — Other Ambulatory Visit (HOSPITAL_BASED_OUTPATIENT_CLINIC_OR_DEPARTMENT_OTHER): Payer: BC Managed Care – PPO | Admitting: Lab

## 2012-06-12 VITALS — BP 105/63 | HR 68 | Temp 98.3°F | Resp 20 | Ht 68.5 in | Wt 156.5 lb

## 2012-06-12 DIAGNOSIS — C50419 Malignant neoplasm of upper-outer quadrant of unspecified female breast: Secondary | ICD-10-CM

## 2012-06-12 DIAGNOSIS — C50219 Malignant neoplasm of upper-inner quadrant of unspecified female breast: Secondary | ICD-10-CM

## 2012-06-12 DIAGNOSIS — C50919 Malignant neoplasm of unspecified site of unspecified female breast: Secondary | ICD-10-CM

## 2012-06-12 DIAGNOSIS — E559 Vitamin D deficiency, unspecified: Secondary | ICD-10-CM

## 2012-06-12 DIAGNOSIS — Z170 Estrogen receptor positive status [ER+]: Secondary | ICD-10-CM

## 2012-06-12 LAB — COMPREHENSIVE METABOLIC PANEL (CC13)
Alkaline Phosphatase: 46 U/L (ref 40–150)
Glucose: 88 mg/dl (ref 70–99)
Sodium: 142 mEq/L (ref 136–145)
Total Bilirubin: 0.5 mg/dL (ref 0.20–1.20)
Total Protein: 6.5 g/dL (ref 6.4–8.3)

## 2012-06-12 LAB — CBC WITH DIFFERENTIAL/PLATELET
BASO%: 0.5 % (ref 0.0–2.0)
EOS%: 2.1 % (ref 0.0–7.0)
MCH: 32.9 pg (ref 25.1–34.0)
MCHC: 35 g/dL (ref 31.5–36.0)
NEUT%: 63.3 % (ref 38.4–76.8)
RBC: 3.91 10*6/uL (ref 3.70–5.45)
RDW: 13.3 % (ref 11.2–14.5)
lymph#: 1 10*3/uL (ref 0.9–3.3)

## 2012-06-12 NOTE — Telephone Encounter (Signed)
Gave patient calendar for 2014

## 2012-06-13 LAB — CANCER ANTIGEN 27.29: CA 27.29: 22 U/mL (ref 0–39)

## 2012-07-20 ENCOUNTER — Ambulatory Visit (INDEPENDENT_AMBULATORY_CARE_PROVIDER_SITE_OTHER): Payer: BC Managed Care – PPO | Admitting: Family Medicine

## 2012-07-20 ENCOUNTER — Encounter: Payer: Self-pay | Admitting: Family Medicine

## 2012-07-20 VITALS — BP 101/70 | HR 75 | Temp 98.0°F | Ht 67.5 in | Wt 159.0 lb

## 2012-07-20 DIAGNOSIS — M8580 Other specified disorders of bone density and structure, unspecified site: Secondary | ICD-10-CM | POA: Insufficient documentation

## 2012-07-20 DIAGNOSIS — M899 Disorder of bone, unspecified: Secondary | ICD-10-CM

## 2012-07-20 DIAGNOSIS — K219 Gastro-esophageal reflux disease without esophagitis: Secondary | ICD-10-CM | POA: Insufficient documentation

## 2012-07-20 DIAGNOSIS — M949 Disorder of cartilage, unspecified: Secondary | ICD-10-CM

## 2012-07-20 DIAGNOSIS — M722 Plantar fascial fibromatosis: Secondary | ICD-10-CM | POA: Insufficient documentation

## 2012-07-20 DIAGNOSIS — C50219 Malignant neoplasm of upper-inner quadrant of unspecified female breast: Secondary | ICD-10-CM

## 2012-07-20 MED ORDER — ESOMEPRAZOLE MAGNESIUM 40 MG PO CPDR: 40.0000 mg | DELAYED_RELEASE_CAPSULE | Freq: Every day | ORAL | Status: DC

## 2012-07-20 NOTE — Assessment & Plan Note (Signed)
New.  dx'd on DEXA 2 yrs ago.  Due for repeat screen.  Taking Ca and Vit D daily.  Will watch closely as pt is on daily PPI.  Pt expressed understanding and is in agreement w/ plan.

## 2012-07-20 NOTE — Assessment & Plan Note (Signed)
New.  Pt previously well maintained on Nexium but has been out of meds for ~5 months.  Has been taking OTC omeprazole w/out relief.  Will restart Nexium (watching closely for bone loss in setting of osteopenia).  Pt may need repeat upper GI.  Will follow.

## 2012-07-20 NOTE — Patient Instructions (Addendum)
Schedule your complete physical at your convenience Restart the Nexium Ice and stretch your feet twice daily for the plantar fasciitis Wear good supportive shoes- insoles help Call with any questions or concerns Welcome!  We're glad to have you!

## 2012-07-20 NOTE — Assessment & Plan Note (Signed)
New.  Will hold on NSAIDs given GERD.  Start icing, stretching, and pt to wear insoles w/ good supportive shoes.  Reviewed supportive care and red flags that should prompt return.  Pt expressed understanding and is in agreement w/ plan.

## 2012-07-20 NOTE — Progress Notes (Signed)
  Subjective:    Patient ID: Sandra [name], female    DOB: 1956-06-14, 56 y.o.   MRN: [id number]  HPI New to establish.  previous PCP- Raquel James, last seen 2.5 yrs ago.  OncDonnie Coffin.  Breast Cancer- dx'd in 2011, s/p double mastectomy.  Following w/ Dr Donnie Coffin regularly, q6 months.  On Tamoxifen.  Current plan is 5 yrs of Tamoxifen but this may increase.  Health Maintenance- has never had colonoscopy, overdue on pap.  GERD- chronic problem, has seen GI previously (14-15 yrs ago) and had upper endoscopy.  Has been on Nexium off and on since.  Nexium works better than other options- has been using OTC omeprazole w/out good relief.  Osteopenia- dx'd in 2011 s/p tx for breast cancer.  Taking Caltrate + D twice daily.  Due for repeat DEXA this year.  Bilateral foot pain- for 5-6 months, worse in the AM and after prolonged sitting.  'feels like someone beat me in my feet'.     Review of Systems For ROS see HPI     Objective:   Physical Exam  Vitals reviewed. Constitutional: She is oriented to person, place, and time. She appears well-developed and well-nourished. No distress.  HENT:  Head: Normocephalic and atraumatic.  Eyes: Conjunctivae normal and EOM are normal. Pupils are equal, round, and reactive to light.  Neck: Normal range of motion. Neck supple. No thyromegaly present.  Cardiovascular: Normal rate, regular rhythm, normal heart sounds and intact distal pulses.   No murmur heard. Pulmonary/Chest: Effort normal and breath sounds normal. No respiratory distress.  Abdominal: Soft. She exhibits no distension. There is no tenderness.  Musculoskeletal: She exhibits tenderness (TTP over insertion of plantar fascia). She exhibits no edema.  Lymphadenopathy:    She has no cervical adenopathy.  Neurological: She is alert and oriented to person, place, and time.  Skin: Skin is warm and dry.  Psychiatric: She has a normal mood and affect. Her behavior is normal.          Assessment &  Plan:

## 2012-07-23 ENCOUNTER — Telehealth: Payer: Self-pay

## 2012-07-23 NOTE — Telephone Encounter (Signed)
Pt returning a call from [place] office. I spoke with Tresa Moore and she stated called pt to get info. To get pt medical records faxed over but fount pt previous info. So I advised pt Kim fount the info.     MW

## 2012-08-03 ENCOUNTER — Other Ambulatory Visit: Payer: BC Managed Care – PPO

## 2012-08-03 ENCOUNTER — Ambulatory Visit
Admission: RE | Admit: 2012-08-03 | Discharge: 2012-08-03 | Disposition: A | Discharge status: Home / Self Care | Payer: BC Managed Care – PPO | Source: Ambulatory Visit | Attending: Family Medicine | Admitting: Family Medicine

## 2012-08-03 DIAGNOSIS — M8580 Other specified disorders of bone density and structure, unspecified site: Secondary | ICD-10-CM

## 2012-08-07 ENCOUNTER — Other Ambulatory Visit (HOSPITAL_COMMUNITY)
Admission: RE | Admit: 2012-08-07 | Discharge: 2012-08-07 | Disposition: A | Discharge status: Home / Self Care | Payer: BC Managed Care – PPO | Source: Ambulatory Visit | Attending: Family Medicine | Admitting: Family Medicine

## 2012-08-07 ENCOUNTER — Ambulatory Visit (INDEPENDENT_AMBULATORY_CARE_PROVIDER_SITE_OTHER): Payer: BC Managed Care – PPO | Admitting: Family Medicine

## 2012-08-07 ENCOUNTER — Encounter: Payer: Self-pay | Admitting: Family Medicine

## 2012-08-07 VITALS — BP 106/76 | HR 67 | Temp 97.4°F | Resp 16 | Ht 68.5 in | Wt 161.0 lb

## 2012-08-07 DIAGNOSIS — Z23 Encounter for immunization: Secondary | ICD-10-CM

## 2012-08-07 DIAGNOSIS — Z01419 Encounter for gynecological examination (general) (routine) without abnormal findings: Secondary | ICD-10-CM | POA: Insufficient documentation

## 2012-08-07 DIAGNOSIS — Z124 Encounter for screening for malignant neoplasm of cervix: Secondary | ICD-10-CM

## 2012-08-07 DIAGNOSIS — D259 Leiomyoma of uterus, unspecified: Secondary | ICD-10-CM

## 2012-08-07 DIAGNOSIS — Z1211 Encounter for screening for malignant neoplasm of colon: Secondary | ICD-10-CM

## 2012-08-07 LAB — LIPID PANEL
HDL: 73.9 mg/dL (ref >39.00)
LDL Cholesterol: 92 mg/dL (ref 0–99)
VLDL: 11.2 mg/dL (ref 0.0–40.0)

## 2012-08-07 NOTE — Patient Instructions (Signed)
We'll call you with your ultrasound appt We'll notify you of your lab results Keep up the good work!  You look great! Call with any questions or concerns Happy Fall!!

## 2012-08-07 NOTE — Assessment & Plan Note (Signed)
Pt's PE WNL w/ exception of fibroid uterus.  Reviewed labs done at the [facility]- will get TSH and lipid panel today as these were not done.  Anticipatory guidance provided.

## 2012-08-07 NOTE — Progress Notes (Signed)
  Subjective:    Patient ID: Sandra [name], female    DOB: Jun 27, 1956, 56 y.o.   MRN: [id number]  HPI CPE- UTD on DEXA, no longer having mammo (s/p bilateral mastectomy), has not had colonoscopy.  Open to referral.   Review of Systems Patient reports no vision/ hearing changes, adenopathy,fever, weight change,  persistant/recurrent hoarseness , swallowing issues, chest pain, palpitations, edema, persistant/recurrent cough, hemoptysis, dyspnea (rest/exertional/paroxysmal nocturnal), gastrointestinal bleeding (melena, rectal bleeding), abdominal pain, significant heartburn, bowel changes, GU symptoms (dysuria, hematuria, incontinence), Gyn symptoms (abnormal  bleeding, pain),  syncope, focal weakness, memory loss, numbness & tingling, skin/hair/nail changes, abnormal bruising or bleeding, anxiety, or depression.     Objective:   Physical Exam  General Appearance:    Alert, cooperative, no distress, appears stated age  Head:    Normocephalic, without obvious abnormality, atraumatic  Eyes:    PERRL, conjunctiva/corneas clear, EOM's intact, fundi    benign, both eyes  Ears:    Normal TM's and external ear canals, both ears  Nose:   Nares normal, septum midline, mucosa normal, no drainage    or sinus tenderness  Throat:   Lips, mucosa, and tongue normal; teeth and gums normal  Neck:   Supple, symmetrical, trachea midline, no adenopathy;    Thyroid: no enlargement/tenderness/nodules  Back:     Symmetric, no curvature, ROM normal, no CVA tenderness  Lungs:     Clear to auscultation bilaterally, respirations unlabored  Chest Wall:    No tenderness or deformity   Heart:    Regular rate and rhythm, S1 and S2 normal, no murmur, rub   or gallop  Breast Exam:    No tenderness, pt w/ implants s/p breast reconstruction  Abdomen:     Soft, non-tender, bowel sounds active all four quadrants,    no masses, no organomegaly  Genitalia:    External genitalia normal, cervix normal in appearance, no CMT,  uterus enlarged and firm due to presence of fibroid(s), adnexa w/out mass or tenderness, mucosa pink and moist, no lesions or discharge present  Rectal:    Normal external appearance  Extremities:   Extremities normal, atraumatic, no cyanosis or edema  Pulses:   2+ and symmetric all extremities  Skin:   Skin color, texture, turgor normal, no rashes or lesions  Lymph nodes:   Cervical, supraclavicular, and axillary nodes normal  Neurologic:   CNII-XII intact, normal strength, sensation and reflexes    throughout          Assessment & Plan:

## 2012-08-07 NOTE — Assessment & Plan Note (Signed)
Pap collected. 

## 2012-08-07 NOTE — Assessment & Plan Note (Signed)
Pt reports it has been ~3 yrs since last [place] to assess.  Will order.

## 2012-08-07 NOTE — Addendum Note (Signed)
Addended by: Sheliah Hatch on: 08/07/2012 11:17 AM   Modules accepted: Orders

## 2012-08-10 ENCOUNTER — Encounter: Payer: Self-pay | Admitting: *Deleted

## 2012-08-10 ENCOUNTER — Telehealth: Payer: Self-pay | Admitting: Family Medicine

## 2012-08-10 NOTE — Telephone Encounter (Signed)
LM ON TRIAGE LINE 1257PM returning call from dr.taboris nurse - please call back at [phone]

## 2012-08-10 NOTE — Telephone Encounter (Signed)
Pt is returning call from referral coordinator. Call given to referral coordinator.

## 2012-08-14 ENCOUNTER — Ambulatory Visit (HOSPITAL_COMMUNITY)
Admission: RE | Admit: 2012-08-14 | Discharge: 2012-08-14 | Disposition: A | Discharge status: Home / Self Care | Payer: BC Managed Care – PPO | Source: Ambulatory Visit | Attending: Family Medicine | Admitting: Family Medicine

## 2012-08-14 DIAGNOSIS — D259 Leiomyoma of uterus, unspecified: Secondary | ICD-10-CM | POA: Insufficient documentation

## 2012-08-17 ENCOUNTER — Other Ambulatory Visit: Payer: Self-pay | Admitting: Oncology

## 2012-08-26 ENCOUNTER — Telehealth: Payer: Self-pay | Admitting: *Deleted

## 2012-08-26 NOTE — Telephone Encounter (Signed)
Per Dr Beverely Low: Osteopenia Take calcium 1200+ vitamin d 800 daily. Discuss with patient copy of report scan to chart.

## 2012-08-28 ENCOUNTER — Encounter: Payer: Self-pay | Admitting: Family Medicine

## 2012-08-31 ENCOUNTER — Telehealth: Payer: Self-pay | Admitting: *Deleted

## 2012-08-31 NOTE — Telephone Encounter (Signed)
Mailed out calendar to inform the patient of the new date and time due to md of vac 

## 2012-09-02 ENCOUNTER — Encounter: Payer: Self-pay | Admitting: Oncology

## 2012-09-07 ENCOUNTER — Telehealth: Payer: Self-pay | Admitting: Family Medicine

## 2012-09-07 ENCOUNTER — Ambulatory Visit (INDEPENDENT_AMBULATORY_CARE_PROVIDER_SITE_OTHER): Payer: BC Managed Care – PPO | Admitting: Family Medicine

## 2012-09-07 ENCOUNTER — Encounter: Payer: Self-pay | Admitting: Family Medicine

## 2012-09-07 VITALS — BP 90/50 | HR 72 | Temp 98.1°F | Ht 67.5 in | Wt 155.0 lb

## 2012-09-07 DIAGNOSIS — L259 Unspecified contact dermatitis, unspecified cause: Secondary | ICD-10-CM

## 2012-09-07 DIAGNOSIS — L309 Dermatitis, unspecified: Secondary | ICD-10-CM | POA: Insufficient documentation

## 2012-09-07 NOTE — Telephone Encounter (Signed)
Patient Information:  Caller Name: Arielle  Phone: [phone]  Patient: Sandra [name], Sandra [name]  Gender: Female  DOB: 03-Feb-1956  Age: 56 Years  PCP: Sheliah Hatch  Pregnant: No   Symptoms  Reason For Call & Symptoms: Left eye rash/dryness in inner and outer corners  Reviewed Health History In EMR: Yes  Reviewed Medications In EMR: Yes  Reviewed Allergies In EMR: Yes  Reviewed Surgeries / Procedures: Yes  Date of Onset of Symptoms: 08/17/2012  Treatments Tried: Ointment from drug store may be helped dryness  Treatments Tried Worked: No OB:  LMP: Unknown  Guideline(s) Used:  Eye - Red Without Pus  Disposition Per Guideline:   See Today in Office  Reason For Disposition Reached:   Patient wants to be seen  Advice Given:  Call back if eye worsens  Office Follow Up:  Does the office need to follow up with this patient?: No  Instructions For The Office: N/A  Appointment Scheduled:  09/07/2012 15:00:00 Appointment Scheduled Provider:  Sheliah Hatch.

## 2012-09-07 NOTE — Telephone Encounter (Signed)
Appointment Scheduled:  09/07/2012 15:00:00  Appointment Scheduled Provider:  Sheliah Hatch.

## 2012-09-07 NOTE — Assessment & Plan Note (Signed)
New.  Start low dose OTC topical steroid cream mixed w/ eye specific facial moisturizer.  Reviewed supportive care and red flags that should prompt return.  Pt expressed understanding and is in agreement w/ plan.

## 2012-09-07 NOTE — Patient Instructions (Addendum)
This is a type of eczema Start 1% hydrocortisone cream sparingly twice daily Mix 50/50 w/ eye cream to stay moisturized Call if no improvement or worsening Hang in there! Happy Holidays!

## 2012-09-07 NOTE — Telephone Encounter (Signed)
In reference to GI referral entered on 08/07/12, patient has been contacted by [facility], myself, and I mailed her a reminder letter to call and schedule.  As of today, no response from patient, and no appointment(s) scheduled with GI.

## 2012-09-07 NOTE — Progress Notes (Signed)
  Subjective:    Patient ID: Sandra [name], female    DOB: November 27, 1955, 56 y.o.   MRN: [id number]  HPI Eye irritation- sxs 1st noted 1-2 weeks ago.  Again flared on Thursday.  L eye only.  Occuring around lower lid- rough, scaly.  Intermittently itchy.  No makeup, soap, lotion new or different.  When area initially breaks out is red and swollen, then gradually improves.  Pharmacist recommended topical eye lubricant (mineral oil) w/out relief.   Review of Systems For ROS see HPI     Objective:   Physical Exam  Vitals reviewed. Constitutional: She appears well-developed and well-nourished. No distress.  Eyes: Conjunctivae normal and EOM are normal. Pupils are equal, round, and reactive to light. Right eye exhibits no discharge. Left eye exhibits no discharge.  Skin: Skin is warm and dry. Rash (dry, scaling dermatitis around L lower lid and lateral corner of eye) noted.          Assessment & Plan:

## 2012-09-07 NOTE — Telephone Encounter (Signed)
Noted  

## 2012-10-12 ENCOUNTER — Encounter: Payer: BC Managed Care – PPO | Admitting: Family Medicine

## 2012-10-27 NOTE — Progress Notes (Signed)
Hematology and Oncology Follow Up Visit  Sandra [name] [id number] 16-Nov-1955 57 y.o. 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  Bilateral tissue expanders 06/27/2010.  Right-sided ypTis, ypN0, ER/PR positive, HER-2 negative breast cancer.  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  Status post radiation therapy completed 11/24/2010 to left breast. Currently on tamoxifen  Interim History:  There have been no intercurrent illness, hospitalizations or medication changes. She did undergo reconstruction in the end of 2012 and has bilateral implants. Unfortunately the implants unless had opened up and this had to be changed after a smaller implant. There is some asymmetry. She feels otherwise pretty well.  Medications: I have reviewed the patient's current medications.  Allergies:  Allergies  Allergen Reactions  . Ibuprofen Nausea And Vomiting and Other (See Comments)    Becomes incoherent    Past Medical History, Surgical history, Social history, and Family History were reviewed and updated.  Review of Systems: Constitutional:  Negative for fever, chills, night sweats, anorexia, weight loss, pain. Cardiovascular: no chest pain or dyspnea on exertion Respiratory: no cough, shortness of breath, or wheezing Neurological: negative Dermatological: negative ENT: negative Skin Gastrointestinal: negative Genito-Urinary: negative Hematological and Lymphatic: negative Breast: She is status post bilateral implants there is asymmetry with the left gland being larger than the right. There is no evidence of skin breakdown inferior portion of the  left implant. Both axilla are negative. Musculoskeletal: negative Remaining ROS negative.  Physical Exam: Blood pressure 105/63, pulse 68, temperature 98.3 F (36.8 C), temperature source Oral, resp. rate 20, height 5' 8.5" (1.74 m), weight 156 lb 8 oz (70.988 kg). ECOG: 0 General appearance: alert, cooperative and appears stated age Head: Normocephalic, without obvious abnormality, atraumatic Neck: no adenopathy, no carotid bruit, no JVD, supple, symmetrical, trachea midline and thyroid not enlarged, symmetric, no tenderness/mass/nodules Lymph nodes: Cervical, supraclavicular, and axillary nodes normal. Cardiac : regular rate and rhythm, no murmurs or gallops Pulmonary:clear to auscultation bilaterally and normal percussion bilaterally Breasts: inspection negative, no nipple discharge or bleeding, no masses or nodularity palpable Abdomen:soft, non-tender; bowel sounds normal; no masses,  no organomegaly Extremities negative Neuro: alert, oriented, normal speech, no focal findings or movement disorder noted  Lab Results: Lab Results  Component Value Date   WBC 4.2 06/12/2012   HGB 12.9 06/12/2012   HCT 36.8 06/12/2012   MCV 94.1 06/12/2012   PLT 191 06/12/2012     Chemistry      Component Value Date/Time   NA 142 06/12/2012 1501   NA 141 12/09/2011 1503   K 3.9 06/12/2012 1501   K 3.6 12/09/2011 1503   CL 105 06/12/2012 1501   CL 103 12/09/2011 1503   CO2 29 06/12/2012 1501   CO2 31 12/09/2011 1503   BUN 12.0 06/12/2012 1501   BUN 14 12/09/2011 1503   CREATININE 0.8 06/12/2012 1501   CREATININE 0.69 12/09/2011 1503      Component Value Date/Time   CALCIUM 9.4 06/12/2012 1501   CALCIUM 9.5 12/09/2011 1503   ALKPHOS 46 06/12/2012 1501   ALKPHOS 52 12/09/2011 1503   AST 22 06/12/2012 1501   AST 25 12/09/2011 1503   ALT 21 06/12/2012  1501   ALT 23 12/09/2011 1503   BILITOT 0.50 06/12/2012 1501   BILITOT 0.2* 12/09/2011 1503      .pathology. Radiological Studies: chest  X-ray n/a Mammogram n/a Bone density n/a  Impression and Plan: Kaylene is doing well, she has on going issues with er reconstruction, but the healing has settled down. She is on tamoxifen and is having hot flashes with intermittent menses She will continue tamoxifen for now and defer  A decision on AI till she is seen again in  6 months.  More than 50% of the visit was spent in patient-related counselling   Pierce Crane, MD  Cell [phone]

## 2012-11-23 ENCOUNTER — Telehealth: Payer: Self-pay | Admitting: *Deleted

## 2012-11-23 NOTE — Telephone Encounter (Signed)
Left message for a return phone call to reschedule her appt.  

## 2012-11-23 NOTE — Telephone Encounter (Signed)
Received call back from patient to reschedule her appt. Confirmed appt. For 01/11/13 at 0845 with Melissa Cross,NP.  Then will become Dr.Magrinat.

## 2012-12-06 ENCOUNTER — Other Ambulatory Visit: Payer: Self-pay | Admitting: Family Medicine

## 2012-12-07 ENCOUNTER — Encounter: Payer: Self-pay | Admitting: *Deleted

## 2012-12-07 ENCOUNTER — Encounter: Payer: Self-pay | Admitting: Oncology

## 2012-12-07 NOTE — Progress Notes (Signed)
Mailed letter & calendar to pt. 

## 2013-01-01 ENCOUNTER — Other Ambulatory Visit: Payer: BC Managed Care – PPO | Admitting: Lab

## 2013-01-07 ENCOUNTER — Ambulatory Visit: Payer: BC Managed Care – PPO | Admitting: Oncology

## 2013-01-08 ENCOUNTER — Ambulatory Visit: Payer: BC Managed Care – PPO | Admitting: Oncology

## 2013-01-11 ENCOUNTER — Encounter: Payer: Self-pay | Admitting: Gynecologic Oncology

## 2013-01-11 ENCOUNTER — Ambulatory Visit (HOSPITAL_BASED_OUTPATIENT_CLINIC_OR_DEPARTMENT_OTHER): Payer: 59 | Admitting: Gynecologic Oncology

## 2013-01-11 ENCOUNTER — Ambulatory Visit (HOSPITAL_BASED_OUTPATIENT_CLINIC_OR_DEPARTMENT_OTHER): Payer: 59 | Admitting: Lab

## 2013-01-11 ENCOUNTER — Telehealth: Payer: Self-pay | Admitting: Oncology

## 2013-01-11 VITALS — BP 112/69 | HR 61 | Temp 98.4°F | Resp 20 | Ht 66.0 in | Wt 158.2 lb

## 2013-01-11 DIAGNOSIS — Z170 Estrogen receptor positive status [ER+]: Secondary | ICD-10-CM

## 2013-01-11 DIAGNOSIS — Z853 Personal history of malignant neoplasm of breast: Secondary | ICD-10-CM

## 2013-01-11 DIAGNOSIS — C50219 Malignant neoplasm of upper-inner quadrant of unspecified female breast: Secondary | ICD-10-CM

## 2013-01-11 DIAGNOSIS — C50419 Malignant neoplasm of upper-outer quadrant of unspecified female breast: Secondary | ICD-10-CM

## 2013-01-11 LAB — COMPREHENSIVE METABOLIC PANEL (CC13)
Albumin: 3.7 g/dL (ref 3.5–5.0)
Alkaline Phosphatase: 48 U/L (ref 40–150)
BUN: 15.2 mg/dL (ref 7.0–26.0)
CO2: 30 mEq/L — ABNORMAL HIGH (ref 22–29)
Glucose: 98 mg/dl (ref 70–99)
Total Bilirubin: 0.44 mg/dL (ref 0.20–1.20)

## 2013-01-11 LAB — CBC WITH DIFFERENTIAL/PLATELET
Basophils Absolute: 0 10*3/uL (ref 0.0–0.1)
Eosinophils Absolute: 0.1 10*3/uL (ref 0.0–0.5)
HCT: 38.8 % (ref 34.8–46.6)
HGB: 13.3 g/dL (ref 11.6–15.9)
LYMPH%: 16.1 % (ref 14.0–49.7)
MCV: 93.4 fL (ref 79.5–101.0)
MONO#: 0.4 10*3/uL (ref 0.1–0.9)
MONO%: 8.3 % (ref 0.0–14.0)
NEUT#: 3.7 10*3/uL (ref 1.5–6.5)
Platelets: 211 10*3/uL (ref 145–400)

## 2013-01-11 MED ORDER — VENLAFAXINE HCL ER 37.5 MG PO CP24: 37.5000 mg | ORAL_CAPSULE | Freq: Every day | ORAL | Status: DC

## 2013-01-11 NOTE — Progress Notes (Signed)
ID: Sandra [name]   DOB: September 22, 1956  MR#: [id number]  WUJ#:[id number]  PCP: Sandra Rhymes, MD GYN:  Uses PCP SU: Dr. Jamey Ripa Other: Dr. Kelly Splinter with Plastic Surgery  HISTORY OF PRESENT ILLNESS:  Sandra [name] is a 57 year old [place] woman, who had a screening mammogram performed on 12/14/2009 which showed a spiculated mass 1 o'clock position.  Physical exam at that time showed a large firm palpable mass upper aspect left breast at 1 o'clock.  No palpable lymph nodes were seen.  Ultrasound showed a large ill-defined irregular hypoechoic mass 1 o'clock position measuring 3.0 x 2.5 x 1.8 cm.  Biopsy was recommended and was performed on 12/14/2009.  This was felt to be an invasive ductal cancer, ER/PR positive 99% and 95% respectively, proliferative index 8%, HER-2 was non amplified.  MRI scan performed on the left breast 12/20/2009 showed a mass measuring 2.1 x 2.1 x 2.5 cm in the left breast.  In the upper inner quadrant of the right breast there is an ill-defined lung mass enhancement measuring 2.5 x 2.3 cm.  No ultrasound correlate was identified for this lesion on the left and so an MRI-guided biopsy was scheduled.  The MRI guided biopsy of the right breast was performed on 12/27/09 resulting invasive ductal carcinoma with ductal carcinoma in situ.  She had a CT scan and PET on 01/02/10 which revealed a 4 mm lung nodule with no evidence of metastasis.  She began neoadjuvant chemotherapy beginning with dose dense FEC for 4 cycles from 01/11/10 to 02/22/10 followed by 1 cycle of dose dense Taxotere with poor tolerance and development of PPE.  She was then switched to Abraxane 3 weeks on with one week off for 2 cycles from 03/22/10 to 04/26/10.  On 06/27/10, she underwent bilateral simple mastectomies with bilateral sentinel lymph node dissection and expander placement with final pathology resulting right-sided ypTis, ypN0, ER/PR positive, HER-2 negative breast cancer and left sided ypT2, ypN1 invasive ductal  cancer.  She underwent radiation therapy under the care of Dr. Michell Heinrich from 09/10/10 to 11/02/10.  She was started on Tamoxifen in February 2012 and has tolerated it well since.  INTERVAL HISTORY:  She presents today for continued follow up.  She reports tolerating Tamoxifen well except for moderate hot flashes during the day.  She denies vaginal wetness or discharge.  She reports intermittent sharp pains in the left upper abdomen and she states that she is to schedule an endoscopy and colonoscopy in the near future.  No other concerns voiced.      REVIEW OF SYSTEMS: Constitutional: Feels well.  Moderate hot flashes during the day.  No fever or chills.  Cardiovascular: No chest pain, shortness of breath, or edema.  Pulmonary: No cough or wheeze.  Gastrointestinal: No nausea, vomiting, or diarrhea. No bright red blood per rectum or change in bowel movement.  Genitourinary: No frequency, urgency, or dysuria. No vaginal bleeding or discharge.  Musculoskeletal: No myalgia or joint pain. Neurologic: No weakness, numbness, or change in gait.  Psychology: No depression, anxiety, or insomnia.  PAST MEDICAL HISTORY: Past Medical History  Diagnosis Date  . Cancer     breast  . GERD (gastroesophageal reflux disease)   . Chicken pox   . History of blood transfusion     PAST SURGICAL HISTORY: Past Surgical History  Procedure Laterality Date  . Tonsillectomy  1960s  . Mastectomy  9/30 2011    bilat.  Marland Kitchen Axillary lymph node dissection  08/06/2010  left   . Breast reconstruction  08/15/2011    Procedure: BREAST RECONSTRUCTION;  Surgeon: Wayland Denis, DO;  Location: [facility];  Service: Plastics;  Laterality: Bilateral;  bilateral breast implant exchange  . Breast reconstruction  10/04/2011    Procedure: BREAST RECONSTRUCTION;  Surgeon: Wayland Denis, DO;  Location: [facility];  Service: Plastics;  Laterality: Left;  removal left breast implant with exchange for new  implant    FAMILY HISTORY Family History  Problem Relation Age of Onset  . Anesthesia problems Mother     Nausea  . Heart disease Father   . Heart disease Maternal Grandfather   . Diabetes Paternal Grandfather     GYNECOLOGIC HISTORY:  G0, P0, menarche age 66.  She has a history of fibroids.  No history of hormone replacement therapy.  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       ADVANCED DIRECTIVES:  Living will and Healthcare POA  HEALTH MAINTENANCE: History  Substance Use Topics  . Smoking status: Never Smoker   . Smokeless tobacco: Never Used  . Alcohol Use: Yes     Comment: mild     Colonoscopy:  To schedule  PAP:  08/07/2012  Bone density:  09/02/12 resulting low bone mass  Lipid panel:  08/07/12  Allergies  Allergen Reactions  . Ibuprofen Nausea And Vomiting and Other (See Comments)    Becomes incoherent    Current Outpatient Prescriptions  Medication Sig Dispense Refill  . Ascorbic Acid (VITAMIN C) 1000 MG tablet Take 1,000 mg by mouth daily.      . B Complex-C-E-Zn (BEC/ZINC PO) Take 1 tablet by mouth daily.      Bronson Ing Products (PERIDRIN-C) 200-50-150 MG TABS Take 2 tablets by mouth daily.      . calcium carbonate (OS-CAL) 600 MG TABS Take 600 mg by mouth daily.        . cholecalciferol (VITAMIN D) 400 UNITS TABS Take by mouth daily.        . Cinnamon 500 MG capsule Take 500 mg by mouth daily.      . fish oil-omega-3 fatty acids 1000 MG capsule Take 2 g by mouth daily.        Marland Kitchen glucosamine-chondroitin 500-400 MG tablet Take 1 tablet by mouth daily.      Marland Kitchen NEXIUM 40 MG capsule TAKE ONE CAPSULE BY MOUTH EVERY MORNING  30 capsule  2  . tamoxifen (NOLVADEX) 20 MG tablet TAKE ONE TABLET BY MOUTH ONE TIME DAILY  30 tablet  4  . zinc sulfate 220 MG capsule Take 220 mg by mouth daily.      Marland Kitchen venlafaxine XR (EFFEXOR-XR) 37.5 MG 24 hr capsule Take 1 capsule (37.5 mg total) by mouth daily.  30 capsule  12   No current  facility-administered medications for this visit.    OBJECTIVE: Filed Vitals:   01/11/13 0851  BP: 112/69  Pulse: 61  Temp: 98.4 F (36.9 C)  Resp: 20     Body mass index is 25.55 kg/(m^2).    ECOG FS:  Asymptomatic  General: Well developed, well nourished female in no acute distress. Alert and oriented x 3.  Head/ Neck: Oropharynx clear.  Sclerae anicteric.  Supple without any enlargements.  Lymph node survey: No cervical, supraclavicular, or axillary adenopathy.  Cardiovascular: Regular rate and rhythm. S1 and S2 normal.  Lungs: Clear to auscultation bilaterally. No wheezes/crackles/rhonchi noted.  Skin: No rashes or lesions present.  Back: No CVA tenderness.  Abdomen: Abdomen soft, non-tender and non-obese. Active bowel sounds in all quadrants. No evidence of a fluid wave or abdominal masses.  Breasts: S/P bilateral mastectomies with bilateral implant placement.  No evidence of skin breakdown.  Inspection negative with no nodularity, masses, erythema, or discharge noted bilaterally. Extremities: No bilateral cyanosis, edema, or clubbing.    LAB RESULTS: Lab Results  Component Value Date   WBC 5.0 01/11/2013   NEUTROABS 3.7 01/11/2013   HGB 13.3 01/11/2013   HCT 38.8 01/11/2013   MCV 93.4 01/11/2013   PLT 211 01/11/2013      Chemistry      Component Value Date/Time   NA 142 06/12/2012 1501   NA 141 12/09/2011 1503   K 3.9 06/12/2012 1501   K 3.6 12/09/2011 1503   CL 105 06/12/2012 1501   CL 103 12/09/2011 1503   CO2 29 06/12/2012 1501   CO2 31 12/09/2011 1503   BUN 12.0 06/12/2012 1501   BUN 14 12/09/2011 1503   CREATININE 0.8 06/12/2012 1501   CREATININE 0.69 12/09/2011 1503      Component Value Date/Time   CALCIUM 9.4 06/12/2012 1501   CALCIUM 9.5 12/09/2011 1503   ALKPHOS 46 06/12/2012 1501   ALKPHOS 52 12/09/2011 1503   AST 22 06/12/2012 1501   AST 25 12/09/2011 1503   ALT 21 06/12/2012 1501   ALT 23 12/09/2011 1503   BILITOT 0.50 06/12/2012 1501   BILITOT 0.2* 12/09/2011 1503        Lab Results  Component Value Date   LABCA2 22 06/12/2012    No components found with this basename: [id number]    No results found for this basename: INR,  in the last 168 hours  Urinalysis No results found for this basename: colorurine,  appearanceur,  labspec,  phurine,  glucoseu,  hgbur,  bilirubinur,  ketonesur,  proteinur,  urobilinogen,  nitrite,  leukocytesur    STUDIES: No results found.  ASSESSMENT: 57 y.o. 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  #2  She began neoadjuvant chemotherapy beginning with dose dense FEC for 4 cycles from 01/11/10 to 02/22/10 followed by 1 cycle of dose dense Taxotere with poor tolerance and development of PPE.  She was then switched to Abraxane 3 weeks on with one week off for 2 cycles from 03/22/10 to 04/26/10.    #3  On 06/27/10, she underwent bilateral simple mastectomies with bilateral sentinel lymph node dissection and expander placement with final pathology resulting right-sided ypTis, ypN0, ER/PR positive, HER-2 negative breast cancer and left sided ypT2, ypN1 invasive ductal cancer.    #4  She underwent radiation therapy under the care of Dr. Michell Heinrich from 09/10/10 to 11/02/10.    #5  She was started on Tamoxifen in February 2012 and has tolerated it well since.  PLAN:  She is to continue Tamoxifen as prescribed and begin taking Effexor XL 37.5 mg daily for hot flash management.  We will contact her with the results of her lab work from today.  She is to follow up with Dr. Darnelle Catalan in one year with lab work at that time or sooner if needed.  The patient was reviewed with Dr. Darnelle Catalan, who spoke with the patient about future plans, recommendations, and the options to switch to an aromatase  inhibitor for 3 years or continue Tamoxifen for a total of ten years.  The patient  elected to continue Tamoxifen at this time.  She is advised to call for any concerns.   CROSS, MELISSA DEAL    01/11/2013

## 2013-01-11 NOTE — Patient Instructions (Signed)
Doing well.  We will call you with your lab results.  Plan to follow up in one year with Dr. Darnelle Catalan with lab work at that time or sooner if needed.  Continue Tamoxifen and begin taking Effexor XL 37.5 mg daily.  Please call for any questions or concerns.  Venlafaxine extended-release capsules What is this medicine? VENLAFAXINE (VEN la fax een) is used to treat depression, anxiety and panic disorder. This medicine may be used for other purposes; ask your health care provider or pharmacist if you have questions. What should I tell my health care provider before I take this medicine? They need to know if you have any of these conditions: -anorexia or weight loss -glaucoma -high blood pressure, heart problems or a recent heart attack -high cholesterol levels or receiving treatment for high cholesterol -kidney or liver disease -mania or bipolar disorder -seizures (convulsions) -suicidal thoughts or a previous suicide attempt -thyroid problems -an unusual or allergic reaction to venlafaxine, other medicines, foods, dyes, or preservatives -pregnant or trying to get pregnant -breast-feeding How should I use this medicine? Take this medicine by mouth with a full glass of water. Follow the directions on the prescription label. Do not cut, crush or chew this medicine. Take it with food. Try to take your medicine at about the same time each day. Do not take your medicine more often than directed. Do not stop taking this medicine suddenly except upon the advice of your doctor. Stopping this medicine too quickly may cause serious side effects or your condition may worsen. A special MedGuide will be given to you by the pharmacist with each prescription and refill. Be sure to read this information carefully each time. Talk to your pediatrician regarding the use of this medicine in children. Special care may be needed. Overdosage: If you think you have taken too much of this medicine contact a poison  control center or emergency room at once. NOTE: This medicine is only for you. Do not share this medicine with others. What if I miss a dose? If you miss a dose, take it as soon as you can. If it is almost time for your next dose, take only that dose. Do not take double or extra doses. What may interact with this medicine? Do not take this medicine with any of the following medications: -desvenlafaxine -duloxetine -linezolid -MAOIs like Azilect, Carbex, Eldepryl, Marplan, Nardil, and Parnate -medicines for weight control or appetite -methylene blue (injected into a vein) -nefazodone -procarbazine -tryptophan This medicine may also interact with the following medications: -amphetamine or dextroamphetamine -aspirin and aspirin-like medicines -cimetidine -clozapine -medicines for heart rhythm or blood pressure -medicines for migraine headache like almotriptan, eletriptan, frovatriptan, naratriptan, rizatriptan, sumatriptan, zolmitriptan -medicines that treat or prevent blood clots like warfarin, enoxaparin, and dalteparin -NSAIDS, medicines for pain and inflammation, like ibuprofen or naproxen -other medicines for depression, anxiety, or psychotic disturbances -ritonavir -St. John's wort -tramadol This list may not describe all possible interactions. Give your health care provider a list of all the medicines, herbs, non-prescription drugs, or dietary supplements you use. Also tell them if you smoke, drink alcohol, or use illegal drugs. Some items may interact with your medicine. What should I watch for while using this medicine? Tell your doctor if your symptoms do not get better or if they get worse. Visit your doctor or health care professional for regular checks on your progress. Because it may take several weeks to see the full effects of this medicine, it is important to continue  your treatment as prescribed by your doctor. Patients and their families should watch out for new or  worsening thoughts of suicide or depression. Also watch out for sudden changes in feelings such as feeling anxious, agitated, panicky, irritable, hostile, aggressive, impulsive, severely restless, overly excited and hyperactive, or not being able to sleep. If this happens, especially at the beginning of treatment or after a change in dose, call your health care professional. This medicine can cause an increase in blood pressure. Check with your doctor for instructions on monitoring your blood pressure while taking this medicine. You may get drowsy or dizzy. Do not drive, use machinery, or do anything that needs mental alertness until you know how this medicine affects you. Do not stand or sit up quickly, especially if you are an older patient. This reduces the risk of dizzy or fainting spells. Alcohol may interfere with the effect of this medicine. Avoid alcoholic drinks. Your mouth may get dry. Chewing sugarless gum, sucking hard candy and drinking plenty of water will help. Contact your doctor if the problem does not go away or is severe. What side effects may I notice from receiving this medicine? Side effects that you should report to your doctor or health care professional as soon as possible: -allergic reactions like skin rash, itching or hives, swelling of the face, lips, or tongue -breathing problems -changes in vision -hallucination, loss of contact with reality -seizures -suicidal thoughts or other mood changes -trouble passing urine or change in the amount of urine -unusual bleeding or bruising Side effects that usually do not require medical attention (report to your doctor or health care professional if they continue or are bothersome): -change in sex drive or performance -constipation -increased sweating -loss of appetite -nausea -tremors -weight loss This list may not describe all possible side effects. Call your doctor for medical advice about side effects. You may report side  effects to FDA at 1-800-FDA-1088. Where should I keep my medicine? Keep out of the reach of children. Store at a controlled temperature between 20 and 25 degrees C (68 degrees and 77 degrees F), in a dry place. Throw away any unused medicine after the expiration date. NOTE: This sheet is a summary. It may not cover all possible information. If you have questions about this medicine, talk to your doctor, pharmacist, or health care provider.  2013, [employer]. (01/31/2012 9:05:04 PM)

## 2013-01-11 NOTE — Telephone Encounter (Signed)
, °

## 2013-01-12 ENCOUNTER — Telehealth: Payer: Self-pay | Admitting: Gynecologic Oncology

## 2013-01-12 NOTE — Telephone Encounter (Signed)
Message left about lab work.  Instructed to call the office for any questions or concerns.

## 2013-02-16 ENCOUNTER — Other Ambulatory Visit: Payer: Self-pay | Admitting: Oncology

## 2013-02-16 DIAGNOSIS — Z853 Personal history of malignant neoplasm of breast: Secondary | ICD-10-CM

## 2013-03-14 ENCOUNTER — Other Ambulatory Visit: Payer: Self-pay | Admitting: Family Medicine

## 2013-03-15 NOTE — Telephone Encounter (Signed)
Rx sent to the pharmacy by e-script.//AB/CMA 

## 2013-05-13 ENCOUNTER — Encounter: Payer: Self-pay | Admitting: Family Medicine

## 2013-05-13 ENCOUNTER — Ambulatory Visit (INDEPENDENT_AMBULATORY_CARE_PROVIDER_SITE_OTHER): Payer: 59 | Admitting: Family Medicine

## 2013-05-13 VITALS — BP 100/70 | HR 60 | Temp 98.3°F | Ht 67.5 in | Wt 161.1 lb

## 2013-05-13 DIAGNOSIS — J0100 Acute maxillary sinusitis, unspecified: Secondary | ICD-10-CM

## 2013-05-13 MED ORDER — AMOXICILLIN 875 MG PO TABS: 875.0000 mg | ORAL_TABLET | Freq: Two times a day (BID) | ORAL | Status: DC

## 2013-05-13 NOTE — Assessment & Plan Note (Signed)
Pt's sxs and PE consistent w/ infxn.  Start abx.  Reviewed supportive care and red flags that should prompt return.  Pt expressed understanding and is in agreement w/ plan.  

## 2013-05-13 NOTE — Progress Notes (Signed)
  Subjective:    Patient ID: Sandra [name], female    DOB: 04-11-1956, 57 y.o.   MRN: [id number]  HPI URI- sxs started a 'couple of weeks' ago w/ 'sinus issues'. Developed vertigo last week and sensation of fluid in the ears.  + HAs.  + facial pain/pressure.  No fevers.  Minimal cough.  + nasal congestion.  No known sick contacts.  + nausea, no V/D.   Review of Systems For ROS see HPI     Objective:   Physical Exam  Vitals reviewed. Constitutional: She appears well-developed and well-nourished. No distress.  HENT:  Head: Normocephalic and atraumatic.  Right Ear: Tympanic membrane normal.  Left Ear: Tympanic membrane normal.  Nose: Mucosal edema and rhinorrhea present. Right sinus exhibits maxillary sinus tenderness and frontal sinus tenderness. Left sinus exhibits maxillary sinus tenderness and frontal sinus tenderness.  Mouth/Throat: Uvula is midline and mucous membranes are normal. Posterior oropharyngeal erythema present. No oropharyngeal exudate.  Eyes: Conjunctivae and EOM are normal. Pupils are equal, round, and reactive to light.  Neck: Normal range of motion. Neck supple.  Cardiovascular: Normal rate, regular rhythm and normal heart sounds.   Pulmonary/Chest: Effort normal and breath sounds normal. No respiratory distress. She has no wheezes.  Lymphadenopathy:    She has no cervical adenopathy.          Assessment & Plan:

## 2013-05-13 NOTE — Patient Instructions (Addendum)
This is a sinus infection Start the Amoxicillin twice daily- take w/ food Drink plenty of fluids Tylenol as needed for pain, HA, or fever REST! Call with any questions or concerns Try to enjoy the last weeks of summer!!!

## 2014-01-11 ENCOUNTER — Other Ambulatory Visit (HOSPITAL_BASED_OUTPATIENT_CLINIC_OR_DEPARTMENT_OTHER): Payer: 59

## 2014-01-11 DIAGNOSIS — Z853 Personal history of malignant neoplasm of breast: Secondary | ICD-10-CM

## 2014-01-11 DIAGNOSIS — C50419 Malignant neoplasm of upper-outer quadrant of unspecified female breast: Secondary | ICD-10-CM

## 2014-01-11 LAB — CBC WITH DIFFERENTIAL/PLATELET
BASO%: 0.7 % (ref 0.0–2.0)
Basophils Absolute: 0 10*3/uL (ref 0.0–0.1)
EOS%: 2.6 % (ref 0.0–7.0)
Eosinophils Absolute: 0.1 10*3/uL (ref 0.0–0.5)
HEMATOCRIT: 40 % (ref 34.8–46.6)
HGB: 13.4 g/dL (ref 11.6–15.9)
LYMPH%: 19.1 % (ref 14.0–49.7)
MCH: 31.6 pg (ref 25.1–34.0)
MCHC: 33.6 g/dL (ref 31.5–36.0)
MCV: 94.2 fL (ref 79.5–101.0)
MONO#: 0.3 10*3/uL (ref 0.1–0.9)
MONO%: 8.8 % (ref 0.0–14.0)
NEUT#: 2.7 10*3/uL (ref 1.5–6.5)
NEUT%: 68.8 % (ref 38.4–76.8)
PLATELETS: 190 10*3/uL (ref 145–400)
RBC: 4.25 10*6/uL (ref 3.70–5.45)
RDW: 13.4 % (ref 11.2–14.5)
WBC: 3.9 10*3/uL (ref 3.9–10.3)
lymph#: 0.7 10*3/uL — ABNORMAL LOW (ref 0.9–3.3)

## 2014-01-11 LAB — COMPREHENSIVE METABOLIC PANEL (CC13)
ALT: 25 U/L (ref 0–55)
ANION GAP: 11 meq/L (ref 3–11)
AST: 25 U/L (ref 5–34)
Albumin: 4.2 g/dL (ref 3.5–5.0)
Alkaline Phosphatase: 45 U/L (ref 40–150)
BILIRUBIN TOTAL: 0.54 mg/dL (ref 0.20–1.20)
BUN: 11.1 mg/dL (ref 7.0–26.0)
CO2: 28 mEq/L (ref 22–29)
CREATININE: 0.7 mg/dL (ref 0.6–1.1)
Calcium: 9.7 mg/dL (ref 8.4–10.4)
Chloride: 106 mEq/L (ref 98–109)
Glucose: 111 mg/dl (ref 70–140)
Potassium: 3.8 mEq/L (ref 3.5–5.1)
Sodium: 144 mEq/L (ref 136–145)
Total Protein: 6.7 g/dL (ref 6.4–8.3)

## 2014-01-18 ENCOUNTER — Ambulatory Visit (HOSPITAL_BASED_OUTPATIENT_CLINIC_OR_DEPARTMENT_OTHER): Payer: 59 | Admitting: Oncology

## 2014-01-18 ENCOUNTER — Telehealth: Payer: Self-pay | Admitting: Oncology

## 2014-01-18 VITALS — BP 106/69 | HR 73 | Temp 98.1°F | Resp 18 | Ht 67.5 in | Wt 155.1 lb

## 2014-01-18 DIAGNOSIS — C50919 Malignant neoplasm of unspecified site of unspecified female breast: Secondary | ICD-10-CM

## 2014-01-18 DIAGNOSIS — C50419 Malignant neoplasm of upper-outer quadrant of unspecified female breast: Secondary | ICD-10-CM

## 2014-01-18 NOTE — Telephone Encounter (Signed)
per pof sch appt 9yr from now-sch & gave pt copy of sch

## 2014-01-18 NOTE — Progress Notes (Signed)
ID: LATANGELA [name]   DOB: 1956-05-26  MR#: [id number]  MGQ#:[id number]  PCP: Annye Asa, MD GYN:  Uses PCP SU: Dr. Margot Chimes Other: Dr. Migdalia Dk with Plastic Surgery  HISTORY OF PRESENT ILLNESS:  Sandra [name] is a 58 year old [place] woman, who had a screening mammogram performed on 12/14/2009 which showed a spiculated mass 1 o'clock position.  Physical exam at that time showed a large firm palpable mass upper aspect left breast at 1 o'clock.  No palpable lymph nodes were seen.  Ultrasound showed a large ill-defined irregular hypoechoic mass 1 o'clock position measuring 3.0 x 2.5 x 1.8 cm.  Biopsy was recommended and was performed on 12/14/2009.  This was felt to be an invasive ductal cancer, ER/PR positive 99% and 95% respectively, proliferative index 8%, HER-2 was non amplified.  MRI scan performed on the left breast 12/20/2009 showed a mass measuring 2.1 x 2.1 x 2.5 cm in the left breast.  In the upper inner quadrant of the right breast there is an ill-defined lung mass enhancement measuring 2.5 x 2.3 cm.  No ultrasound correlate was identified for this lesion on the left and so an MRI-guided biopsy was scheduled.  The MRI guided biopsy of the right breast was performed on 12/27/09 resulting invasive ductal carcinoma with ductal carcinoma in situ.  She had a CT scan and PET on 01/02/10 which revealed a 4 mm lung nodule with no evidence of metastasis.  She began neoadjuvant chemotherapy beginning with dose dense FEC for 4 cycles from 01/11/10 to 02/22/10 followed by 1 cycle of dose dense Taxotere with poor tolerance and development of PPE.  She was then switched to Abraxane 3 weeks on with one week off for 2 cycles from 03/22/10 to 04/26/10.  On 06/27/10, she underwent bilateral simple mastectomies with bilateral sentinel lymph node dissection and expander placement with final pathology resulting right-sided ypTis, ypN0, ER/PR positive, HER-2 negative breast cancer and left sided ypT2, ypN1 invasive ductal  cancer.  She underwent radiation therapy under the care of Dr. Pablo Ledger from 09/10/10 to 11/02/10.  She was started on Tamoxifen in February 2012 and has tolerated it well since.  INTERVAL HISTORY:  Sandra [name] returns today for followup of her breast cancer. Interval history is generally unremarkable. She continues on tamoxifen, which is tolerating well aside from issues relating to hot flashes. Vaginal wetness or dryness is not a concern.  REVIEW OF SYSTEMS: She is gaining some weight and we talked about menopause is responsible and tamoxifen does not cause weight gain. She understands that data well. The hot flashes were not well controlled on a very low dose of venlafaxine so she stopped that medication. They do wake her up at night. They can [name] her during the day. She is exercising by walking, 3-5 times a week, 30 minutes at a time or so. A detailed review of systems today was otherwise entirely negative.  PAST MEDICAL HISTORY: Past Medical History  Diagnosis Date  . Cancer     breast  . GERD (gastroesophageal reflux disease)   . Chicken pox   . History of blood transfusion     PAST SURGICAL HISTORY: Past Surgical History  Procedure Laterality Date  . Tonsillectomy  1960s  . Mastectomy  9/30 2011    bilat.  Marland Kitchen Axillary lymph node dissection  08/06/2010    left   . Breast reconstruction  08/15/2011    Procedure: BREAST RECONSTRUCTION;  Surgeon: Theodoro Kos, DO;  Location: [facility];  Service: Clinical cytogeneticist;  Laterality: Bilateral;  bilateral breast implant exchange  . Breast reconstruction  10/04/2011    Procedure: BREAST RECONSTRUCTION;  Surgeon: Theodoro Kos, DO;  Location: [facility];  Service: Plastics;  Laterality: Left;  removal left breast implant with exchange for new implant    FAMILY HISTORY Family History  Problem Relation Age of Onset  . Anesthesia problems Mother     Nausea  . Heart disease Father   . Heart disease Maternal Grandfather    . Diabetes Paternal Grandfather     GYNECOLOGIC HISTORY:  G0, P0, menarche age 78.  She has a history of fibroids.  No history of hormone replacement therapy.  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       ADVANCED DIRECTIVES:  Living will and Healthcare POA  HEALTH MAINTENANCE: History  Substance Use Topics  . Smoking status: Never Smoker   . Smokeless tobacco: Never Used  . Alcohol Use: Yes     Comment: mild     Colonoscopy:  To schedule  PAP:  08/07/2012  Bone density:  09/02/12 resulting low bone mass  Lipid panel:  08/07/12  Allergies  Allergen Reactions  . Ibuprofen Nausea And Vomiting and Other (See Comments)    Becomes incoherent    Current Outpatient Prescriptions  Medication Sig Dispense Refill  . amoxicillin (AMOXIL) 875 MG tablet Take 1 tablet (875 mg total) by mouth 2 (two) times daily.  20 tablet  0  . Ascorbic Acid (VITAMIN C) 1000 MG tablet Take 1,000 mg by mouth daily.      . B Complex-C-E-Zn (BEC/ZINC PO) Take 1 tablet by mouth daily.      Ileene Patrick Products (PERIDRIN-C) 200-50-150 MG TABS Take 2 tablets by mouth daily.      . calcium carbonate (OS-CAL) 600 MG TABS Take 600 mg by mouth daily.        . cholecalciferol (VITAMIN D) 400 UNITS TABS Take by mouth daily.        . Cinnamon 500 MG capsule Take 500 mg by mouth daily.      . fish oil-omega-3 fatty acids 1000 MG capsule Take 2 g by mouth daily.        Marland Kitchen glucosamine-chondroitin 500-400 MG tablet Take 1 tablet by mouth daily.      . tamoxifen (NOLVADEX) 20 MG tablet TAKE ONE TABLET BY MOUTH ONE TIME DAILY  30 tablet  PRN  . venlafaxine XR (EFFEXOR-XR) 37.5 MG 24 hr capsule Take 1 capsule (37.5 mg total) by mouth daily.  30 capsule  12   No current facility-administered medications for this visit.    OBJECTIVE: Middle-aged white woman who appears well Filed Vitals:   01/18/14 1558  BP: 106/69  Pulse: 73  Temp: 98.1 F (36.7 C)  Resp: 18     Body mass  index is 23.92 kg/(m^2).    ECOG FS:  Asymptomatic  Sclerae unicteric, pupils equal and reactive Oropharynx clear and moist No cervical or supraclavicular adenopathy Lungs no rales or rhonchi Heart regular rate and rhythm Abd soft, nontender, positive bowel sounds MSK no focal spinal tenderness, no upper extremity lymphedema Neuro: nonfocal, well oriented, appropriate affect Breasts: Status post bilateral mastectomy; there is no evidence of local recurrence; both axilla are benign.  LAB RESULTS: Lab Results  Component Value Date   WBC 3.9 01/11/2014   NEUTROABS 2.7 01/11/2014   HGB 13.4 01/11/2014   HCT 40.0 01/11/2014   MCV 94.2  01/11/2014   PLT 190 01/11/2014      Chemistry      Component Value Date/Time   NA 144 01/11/2014 0802   NA 141 12/09/2011 1503   K 3.8 01/11/2014 0802   K 3.6 12/09/2011 1503   CL 105 01/11/2013 0937   CL 103 12/09/2011 1503   CO2 28 01/11/2014 0802   CO2 31 12/09/2011 1503   BUN 11.1 01/11/2014 0802   BUN 14 12/09/2011 1503   CREATININE 0.7 01/11/2014 0802   CREATININE 0.69 12/09/2011 1503      Component Value Date/Time   CALCIUM 9.7 01/11/2014 0802   CALCIUM 9.5 12/09/2011 1503   ALKPHOS 45 01/11/2014 0802   ALKPHOS 52 12/09/2011 1503   AST 25 01/11/2014 0802   AST 25 12/09/2011 1503   ALT 25 01/11/2014 0802   ALT 23 12/09/2011 1503   BILITOT 0.54 01/11/2014 0802   BILITOT 0.2* 12/09/2011 1503       Lab Results  Component Value Date   LABCA2 22 06/12/2012    No components found with this basename: LABCA125    No results found for this basename: INR,  in the last 168 hours  Urinalysis No results found for this basename: colorurine,  appearanceur,  labspec,  phurine,  glucoseu,  hgbur,  bilirubinur,  ketonesur,  proteinur,  urobilinogen,  nitrite,  leukocytesur    STUDIES: No results found.  ASSESSMENT: 58 y.o. [place] woman: #1  S/P biopsy of the left breast in 11/2009 for a clinical T2 N0 IDC, ER 99%, PR 95%, HER2-, Ki67 8% and biopsy of the  right breast on 12/27/09 for IDC with DCIS, ER 98%, PR 100%, Ki67 8%, HER2-.  #2  She began neoadjuvant chemotherapy beginning with dose dense FEC for 4 cycles from 01/11/10 to 02/22/10 followed by 1 cycle of dose dense Taxotere with poor tolerance and development of PPE.  She was then switched to Abraxane 3 weeks on with one week off for 2 cycles from 03/22/10 to 04/26/10.    #3  On 06/27/10, she underwent bilateral simple mastectomies with bilateral sentinel lymph node dissection and expander placement with final pathology resulting right-sided ypTis, ypN0, ER/PR positive, HER-2 negative breast cancer and left sided ypT2, ypN1 invasive ductal cancer.    #4  She underwent radiation therapy under the care of Dr. Pablo Ledger from 09/10/10 to 11/02/10.    #5  She was started on Tamoxifen in February 2012 and has tolerated it well since.  PLAN:  Marya Amsler going to continue on tamoxifen for a total of 10 years. In the meantime we are going to try gabapentin for the nighttime hot flashes. She will call if there are no problems with that.  She understands the dose of venlafaxine she took, 37.5 mg, is minimal and she might get a better response from 75 mg. If the hot flashes become very difficult during the day this summer she will let us know.    Otherwise she will return to see [place] in 1 year. She knows to call for any problems that may develop before that visit.  Virgie Dad Magrinat    01/18/2014

## 2014-01-20 ENCOUNTER — Telehealth: Payer: Self-pay

## 2014-01-20 NOTE — Telephone Encounter (Signed)
Left message for call back Non identifiable  

## 2014-01-21 ENCOUNTER — Telehealth: Payer: Self-pay | Admitting: Family Medicine

## 2014-01-21 ENCOUNTER — Encounter: Payer: Self-pay | Admitting: Family Medicine

## 2014-01-21 ENCOUNTER — Ambulatory Visit (INDEPENDENT_AMBULATORY_CARE_PROVIDER_SITE_OTHER): Payer: 59 | Admitting: Family Medicine

## 2014-01-21 VITALS — BP 104/70 | HR 69 | Temp 98.0°F | Resp 16 | Ht 67.5 in | Wt 153.4 lb

## 2014-01-21 DIAGNOSIS — M722 Plantar fascial fibromatosis: Secondary | ICD-10-CM

## 2014-01-21 DIAGNOSIS — Z01419 Encounter for gynecological examination (general) (routine) without abnormal findings: Secondary | ICD-10-CM

## 2014-01-21 DIAGNOSIS — R5381 Other malaise: Secondary | ICD-10-CM | POA: Insufficient documentation

## 2014-01-21 DIAGNOSIS — Z1211 Encounter for screening for malignant neoplasm of colon: Secondary | ICD-10-CM

## 2014-01-21 DIAGNOSIS — R5383 Other fatigue: Secondary | ICD-10-CM

## 2014-01-21 DIAGNOSIS — K219 Gastro-esophageal reflux disease without esophagitis: Secondary | ICD-10-CM

## 2014-01-21 NOTE — Assessment & Plan Note (Signed)
Pt's PE WNL.  UTD on health maintenance w/ exception of colonoscopy- pt again referred to GI.  Check labs.  Anticipatory guidance provided.

## 2014-01-21 NOTE — Assessment & Plan Note (Signed)
Pt has multiple causes for fatigue- current Tamoxifen use, age, hormonal.  Check for other possible causes- thyroid abnormality, anemia, electrolyte abnormality.  Treat as needed.

## 2014-01-21 NOTE — Telephone Encounter (Signed)
Appt scheduled for 3:30pm today.  Unable to reach previsit.

## 2014-01-21 NOTE — Telephone Encounter (Signed)
Caller name: [place]  Relation to UJ:WJXB Call back number:[phone] Pharmacy:  Reason for call: patient is returning call in regards to pre-visit phone call.

## 2014-01-21 NOTE — Assessment & Plan Note (Signed)
Recurrent issue.  Refer to sports med for ongoing tx.  Pt expressed understanding and is in agreement w/ plan.

## 2014-01-21 NOTE — Assessment & Plan Note (Signed)
Recurrent problem.  Restart Nexium.  Refer to GI

## 2014-01-21 NOTE — Patient Instructions (Signed)
Follow up as needed We'll call you with your Sports Med and GI appts Restart your Nexium We'll notify you of your lab results and make any changes if needed Call with any questions or concerns Keep up the good work!  You look great!

## 2014-01-21 NOTE — Progress Notes (Signed)
   Subjective:    Patient ID: Sandra [name], female    DOB: 03-18-1956, 58 y.o.   MRN: [id number]  HPI CPE- no longer having mammograms due to bilateral mastectomy.  Has never had colonoscopy.  'i feel like i have a couple years grace period due to the chemo'.    Fatigue- ongoing issue, hx of low normal Vit D.  Currently on Tamoxifen.   Review of Systems Patient reports no vision/ hearing changes, adenopathy,fever, weight change,  persistant/recurrent hoarseness , swallowing issues, chest pain, palpitations, edema, persistant/recurrent cough, hemoptysis, dyspnea (rest/exertional/paroxysmal nocturnal), gastrointestinal bleeding (melena, rectal bleeding), abdominal pain, bowel changes, GU symptoms (dysuria, hematuria, incontinence), Gyn symptoms (abnormal  bleeding, pain),  syncope, focal weakness, memory loss, numbness & tingling, skin/hair/nail changes, abnormal bruising or bleeding, anxiety, or depression.  +GERD    Objective:   Physical Exam General Appearance:    Alert, cooperative, no distress, appears stated age  Head:    Normocephalic, without obvious abnormality, atraumatic  Eyes:    PERRL, conjunctiva/corneas clear, EOM's intact, fundi    benign, both eyes  Ears:    Normal TM's and external ear canals, both ears  Nose:   Nares normal, septum midline, mucosa normal, no drainage    or sinus tenderness  Throat:   Lips, mucosa, and tongue normal; teeth and gums normal  Neck:   Supple, symmetrical, trachea midline, no adenopathy;    Thyroid: no enlargement/tenderness/nodules  Back:     Symmetric, no curvature, ROM normal, no CVA tenderness  Lungs:     Clear to auscultation bilaterally, respirations unlabored  Chest Wall:    No tenderness or deformity   Heart:    Regular rate and rhythm, S1 and S2 normal, no murmur, rub   or gallop  Breast Exam:    Deferred to Oncology  Abdomen:     Soft, non-tender, bowel sounds active all four quadrants,    no masses, no organomegaly    Genitalia:    Deferred  Rectal:    Extremities:   Extremities normal, atraumatic, no cyanosis or edema  Pulses:   2+ and symmetric all extremities  Skin:   Skin color, texture, turgor normal, no rashes or lesions  Lymph nodes:   Cervical, supraclavicular, and axillary nodes normal  Neurologic:   CNII-XII intact, normal strength, sensation and reflexes    throughout          Assessment & Plan:

## 2014-01-22 LAB — LIPID PANEL
CHOL/HDL RATIO: 2.3 ratio
CHOLESTEROL: 178 mg/dL (ref 0–200)
HDL: 76 mg/dL (ref >39)
LDL CALC: 88 mg/dL (ref 0–99)
Triglycerides: 69 mg/dL (ref <150)
VLDL: 14 mg/dL (ref 0–40)

## 2014-01-22 LAB — HEPATIC FUNCTION PANEL
ALT: 23 U/L (ref 0–35)
AST: 24 U/L (ref 0–37)
Albumin: 4.4 g/dL (ref 3.5–5.2)
Alkaline Phosphatase: 39 U/L (ref 39–117)
BILIRUBIN INDIRECT: 0.3 mg/dL (ref 0.2–1.2)
Bilirubin, Direct: 0.1 mg/dL (ref 0.0–0.3)
Total Bilirubin: 0.4 mg/dL (ref 0.2–1.2)
Total Protein: 6.3 g/dL (ref 6.0–8.3)

## 2014-01-22 LAB — CBC WITH DIFFERENTIAL/PLATELET
Basophils Absolute: 0 10*3/uL (ref 0.0–0.1)
Basophils Relative: 0 % (ref 0–1)
Eosinophils Absolute: 0.1 10*3/uL (ref 0.0–0.7)
Eosinophils Relative: 2 % (ref 0–5)
HCT: 38.5 % (ref 36.0–46.0)
HEMOGLOBIN: 13.1 g/dL (ref 12.0–15.0)
LYMPHS ABS: 1 10*3/uL (ref 0.7–4.0)
LYMPHS PCT: 21 % (ref 12–46)
MCH: 31.3 pg (ref 26.0–34.0)
MCHC: 34 g/dL (ref 30.0–36.0)
MCV: 91.9 fL (ref 78.0–100.0)
MONOS PCT: 8 % (ref 3–12)
Monocytes Absolute: 0.4 10*3/uL (ref 0.1–1.0)
Neutro Abs: 3.4 10*3/uL (ref 1.7–7.7)
Neutrophils Relative %: 69 % (ref 43–77)
PLATELETS: 229 10*3/uL (ref 150–400)
RBC: 4.19 MIL/uL (ref 3.87–5.11)
RDW: 13.4 % (ref 11.5–15.5)
WBC: 4.9 10*3/uL (ref 4.0–10.5)

## 2014-01-22 LAB — FOLATE: Folate: >20 ng/mL

## 2014-01-22 LAB — BASIC METABOLIC PANEL
BUN: 13 mg/dL (ref 6–23)
CHLORIDE: 104 meq/L (ref 96–112)
CO2: 25 meq/L (ref 19–32)
Calcium: 9.2 mg/dL (ref 8.4–10.5)
Creat: 0.75 mg/dL (ref 0.50–1.10)
GLUCOSE: 82 mg/dL (ref 70–99)
Potassium: 4 mEq/L (ref 3.5–5.3)
SODIUM: 142 meq/L (ref 135–145)

## 2014-01-22 LAB — VITAMIN B12: Vitamin B-12: 921 pg/mL — ABNORMAL HIGH (ref 211–911)

## 2014-01-22 LAB — TSH: TSH: 1.313 u[IU]/mL (ref 0.350–4.500)

## 2014-01-24 ENCOUNTER — Encounter: Payer: Self-pay | Admitting: General Practice

## 2014-01-25 ENCOUNTER — Ambulatory Visit: Payer: 59 | Admitting: Family Medicine

## 2014-01-25 DIAGNOSIS — Z0289 Encounter for other administrative examinations: Secondary | ICD-10-CM

## 2014-01-25 LAB — VITAMIN D 1,25 DIHYDROXY
VITAMIN D 1, 25 (OH) TOTAL: 50 pg/mL (ref 18–72)
VITAMIN D3 1, 25 (OH): 50 pg/mL
Vitamin D2 1, 25 (OH)2: <8 pg/mL

## 2014-01-26 ENCOUNTER — Encounter: Payer: Self-pay | Admitting: General Practice

## 2014-02-23 ENCOUNTER — Encounter: Payer: Self-pay | Admitting: Family Medicine

## 2014-03-19 ENCOUNTER — Other Ambulatory Visit: Payer: Self-pay | Admitting: Oncology

## 2014-03-19 DIAGNOSIS — C50919 Malignant neoplasm of unspecified site of unspecified female breast: Secondary | ICD-10-CM

## 2014-07-19 ENCOUNTER — Ambulatory Visit (INDEPENDENT_AMBULATORY_CARE_PROVIDER_SITE_OTHER): Payer: 59 | Admitting: Family Medicine

## 2014-07-19 ENCOUNTER — Encounter: Payer: Self-pay | Admitting: Family Medicine

## 2014-07-19 ENCOUNTER — Other Ambulatory Visit (INDEPENDENT_AMBULATORY_CARE_PROVIDER_SITE_OTHER): Payer: 59

## 2014-07-19 VITALS — BP 110/62 | HR 64 | Ht 68.0 in | Wt 152.0 lb

## 2014-07-19 DIAGNOSIS — M222X1 Patellofemoral disorders, right knee: Secondary | ICD-10-CM | POA: Insufficient documentation

## 2014-07-19 DIAGNOSIS — M216X1 Other acquired deformities of right foot: Secondary | ICD-10-CM

## 2014-07-19 DIAGNOSIS — M79671 Pain in right foot: Secondary | ICD-10-CM

## 2014-07-19 DIAGNOSIS — M216X9 Other acquired deformities of unspecified foot: Secondary | ICD-10-CM | POA: Insufficient documentation

## 2014-07-19 NOTE — Patient Instructions (Signed)
Good to meet you.  Ice bath to feet for 20 minutes nightly.  Vitamin D 2000 IU daily.  Turmeric 500mg  twice daily.  Spenco orthotics "total support" online.  Eugenie Birks and New balance greater then 700. Exercises 3 times a week. Alternate from knee and feet.  For knee biking is a great exercise.  See me again in 3 weeks.

## 2014-07-19 NOTE — Assessment & Plan Note (Signed)
Patient does have what appears to be more of a patellofemoral joint difficulty. We discussed with patient at great length. Patient was given a home exercise program, icing protocol and we discussed over-the-counter medications. Patient of course wants to avoid any prescription medications if possible. We discussed which activities would be beneficial for this knee and which ones to avoid. Patient will try this as well as given a home exercise handout and will followup in 3 weeks. Continuing to have discomfort we'll consider x-rays and questionable corticosteroid injection.

## 2014-07-19 NOTE — Assessment & Plan Note (Signed)
Discussed icing protocol, home exercises, proper shoe wear, over-the-counter orthotics, over-the-counter medications. Patient will try all these interventions and come back and see me again in 3 weeks for further evaluation. Patient could be a candidate for custom orthotics but we will discuss at next followup.

## 2014-07-19 NOTE — Progress Notes (Signed)
[facility] [address], [place] [address] Phone: [phone] Subjective:    I'm seeing this patient by the request  of:  Annye Asa, MD   CC: Right foot and knee  CZY:[id number] AOI KOUNS is a 58 y.o. female coming in with complaint of right foot and knee pain.  Regarding patient's foot she states that this pain has been for multiple months. Patient does not remember any true injury. Patient states while walking on the foot for greater distances seems to get worse. Describes it as a dull throbbing ache sometimes. Denies any numbness or tingling. Denies any nighttime awakening. Patient states it has not stopped her from any activity but she does notice that she can only wear certain shoes now secondary to pain. Has noticed that her post have been changing direction as well. Patient rates the severity of 6/10. Denies any swelling.  Patient's right knee started giving her difficulty she noticed after hitting a coffee table multiple months ago.  Patient states it seems to hurt more on the superior lateral aspect of the knee. Worse with taking stairs going up or down. Not stopping her from any activity. Can hurt after sitting for long amount of time. Denies any swelling or any redness. Denies any clicking, popping or giving out on her. Patient rates the severity is 4/10. Patient is motivated to start going to the gym but is concerned secondary to the pain. No nighttime awakening. Does respond over-the-counter medications such as Tylenol.     Past medical history, social, surgical and family history all reviewed in electronic medical record.   Review of Systems: No headache, visual changes, nausea, vomiting, diarrhea, constipation, dizziness, abdominal pain, skin rash, fevers, chills, night sweats, weight loss, swollen lymph nodes, body aches, joint swelling, muscle aches, chest pain, shortness of breath, mood changes.   Objective Blood pressure  110/62, pulse 64, height 5\' 8"  (1.727 m), weight 152 lb (68.947 kg), last menstrual period 11/28/2008, SpO2 97.00%.  General: No apparent distress alert and oriented x3 mood and affect normal, dressed appropriately.  HEENT: Pupils equal, extraocular movements intact  Respiratory: Patient's speak in full sentences and does not appear short of breath  Cardiovascular: No lower extremity edema, non tender, no erythema  Skin: Warm dry intact with no signs of infection or rash on extremities or on axial skeleton.  Abdomen: Soft nontender  Neuro: Cranial nerves II through XII are intact, neurovascularly intact in all extremities with 2+ DTRs and 2+ pulses.  Lymph: No lymphadenopathy of posterior or anterior cervical chain or axillae bilaterally.  Gait normal with good balance and coordination.  MSK:  Non tender with full range of motion and good stability and symmetric strength and tone of shoulders, elbows, wrist, hip, and ankles bilaterally.  Knee: Right Normal to inspection with no erythema or effusion or obvious bony abnormalities. Mild atrophy of the VMO compared to the contralateral side Palpation normal with no warmth, joint line tenderness, patellar tenderness, or condyle tenderness. ROM full in flexion and extension and lower leg rotation. Ligaments with solid consistent endpoints including ACL, PCL, LCL, MCL. Negative Mcmurray's, Apley's, and Thessalonian tests.  painful patellar compression with lateral tracking. Patellar glide minimal crepitus. Patellar and quadriceps tendons unremarkable. Hamstring and quadriceps strength is normal.  Contralateral knee unremarkable  Foot exam shows the patient does have breakdown of the transverse arch bilaterally right greater than left. Patient is a bunion and bunionette formation right greater than left with some  mild fibular deviation of the first toe. Patient also has hammering of the fourth and fifth toes. Patient does have callus formation on the  plantar aspect of the foot over the second and third metatarsal heads. Neurovascularly intact distally with full range of motion of the ankle as well as the large toe.  MSK US performed of: Right knee This study was ordered, performed, and interpreted by Charlann Boxer D.O.  Knee: All structures visualized. Anteromedial, anterolateral, posteromedial, and posterolateral menisci unremarkable without tearing, fraying, effusion, or displacement. Mild narrowing of the superior lateral patellofemoral joint. Patellar Tendon unremarkable on long and transverse views without effusion. No abnormality of prepatellar bursa. LCL and MCL unremarkable on long and transverse views. No abnormality of origin of medial or lateral head of the gastrocnemius.  IMPRESSION:  Mild patellofemoral joint arthritis.    Impression and Recommendations:     This case required medical decision making of moderate complexity.

## 2014-08-09 ENCOUNTER — Ambulatory Visit: Payer: 59 | Admitting: Family Medicine

## 2014-08-18 ENCOUNTER — Ambulatory Visit: Payer: 59 | Admitting: Family Medicine

## 2014-12-07 ENCOUNTER — Telehealth: Payer: Self-pay | Admitting: Oncology

## 2014-12-07 NOTE — Telephone Encounter (Signed)
per GM to move to HB-GM on PAL-cld & left pt a message and gave updated time & date

## 2015-01-12 ENCOUNTER — Other Ambulatory Visit: Payer: 59

## 2015-01-13 ENCOUNTER — Other Ambulatory Visit (HOSPITAL_BASED_OUTPATIENT_CLINIC_OR_DEPARTMENT_OTHER): Payer: 59

## 2015-01-13 DIAGNOSIS — C50912 Malignant neoplasm of unspecified site of left female breast: Secondary | ICD-10-CM | POA: Diagnosis not present

## 2015-01-13 DIAGNOSIS — C50919 Malignant neoplasm of unspecified site of unspecified female breast: Secondary | ICD-10-CM

## 2015-01-13 LAB — COMPREHENSIVE METABOLIC PANEL (CC13)
ALK PHOS: 47 U/L (ref 40–150)
ALT: 30 U/L (ref 0–55)
ANION GAP: 11 meq/L (ref 3–11)
AST: 27 U/L (ref 5–34)
Albumin: 4.2 g/dL (ref 3.5–5.0)
BILIRUBIN TOTAL: 0.46 mg/dL (ref 0.20–1.20)
BUN: 17.5 mg/dL (ref 7.0–26.0)
CALCIUM: 8.2 mg/dL — AB (ref 8.4–10.4)
CO2: 27 mEq/L (ref 22–29)
Chloride: 105 mEq/L (ref 98–109)
Creatinine: 0.7 mg/dL (ref 0.6–1.1)
Glucose: 66 mg/dl — ABNORMAL LOW (ref 70–140)
POTASSIUM: 3.9 meq/L (ref 3.5–5.1)
SODIUM: 143 meq/L (ref 136–145)
TOTAL PROTEIN: 6.5 g/dL (ref 6.4–8.3)

## 2015-01-13 LAB — CBC WITH DIFFERENTIAL/PLATELET
BASO%: 0.6 % (ref 0.0–2.0)
BASOS ABS: 0 10*3/uL (ref 0.0–0.1)
EOS ABS: 0.1 10*3/uL (ref 0.0–0.5)
EOS%: 1.8 % (ref 0.0–7.0)
HCT: 39.1 % (ref 34.8–46.6)
HEMOGLOBIN: 12.9 g/dL (ref 11.6–15.9)
LYMPH#: 0.8 10*3/uL — AB (ref 0.9–3.3)
LYMPH%: 13.1 % — ABNORMAL LOW (ref 14.0–49.7)
MCH: 30.8 pg (ref 25.1–34.0)
MCHC: 33 g/dL (ref 31.5–36.0)
MCV: 93.3 fL (ref 79.5–101.0)
MONO#: 0.5 10*3/uL (ref 0.1–0.9)
MONO%: 8.4 % (ref 0.0–14.0)
NEUT#: 4.4 10*3/uL (ref 1.5–6.5)
NEUT%: 76.1 % (ref 38.4–76.8)
Platelets: 201 10*3/uL (ref 145–400)
RBC: 4.19 10*6/uL (ref 3.70–5.45)
RDW: 13.2 % (ref 11.2–14.5)
WBC: 5.7 10*3/uL (ref 3.9–10.3)

## 2015-01-19 ENCOUNTER — Ambulatory Visit: Payer: 59 | Admitting: Oncology

## 2015-01-20 ENCOUNTER — Telehealth: Payer: Self-pay | Admitting: Oncology

## 2015-01-20 ENCOUNTER — Encounter: Payer: Self-pay | Admitting: Nurse Practitioner

## 2015-01-20 ENCOUNTER — Ambulatory Visit (HOSPITAL_BASED_OUTPATIENT_CLINIC_OR_DEPARTMENT_OTHER): Payer: 59 | Admitting: Nurse Practitioner

## 2015-01-20 VITALS — BP 115/58 | HR 70 | Temp 97.8°F | Resp 18 | Wt 154.4 lb

## 2015-01-20 DIAGNOSIS — C50912 Malignant neoplasm of unspecified site of left female breast: Secondary | ICD-10-CM

## 2015-01-20 DIAGNOSIS — R232 Flushing: Secondary | ICD-10-CM

## 2015-01-20 DIAGNOSIS — M8580 Other specified disorders of bone density and structure, unspecified site: Secondary | ICD-10-CM

## 2015-01-20 DIAGNOSIS — N951 Menopausal and female climacteric states: Secondary | ICD-10-CM | POA: Diagnosis not present

## 2015-01-20 DIAGNOSIS — C50919 Malignant neoplasm of unspecified site of unspecified female breast: Secondary | ICD-10-CM

## 2015-01-20 MED ORDER — GABAPENTIN 100 MG PO CAPS: 100.0000 mg | ORAL_CAPSULE | Freq: Three times a day (TID) | ORAL | Status: DC

## 2015-01-20 NOTE — Progress Notes (Signed)
ID: SIMAR POTHIER   DOB: Aug 07, 1956  MR#: [id number]  OPF#:[id number]  PCP: Annye Asa, MD GYN:  Uses PCP SU: Dr. Margot Chimes Other: Dr. Migdalia Dk with Plastic Surgery  CHIEF COMPLAINT: bilateral breast cancers CURRENT TREATMENT: tamoxifen daily  BREAST CANCER HISTORY  Sandra [name] is a 59 year old [place] woman, who had a screening mammogram performed on 12/14/2009 which showed a spiculated mass 1 o'clock position.  Physical exam at that time showed a large firm palpable mass upper aspect left breast at 1 o'clock.  No palpable lymph nodes were seen.  Ultrasound showed a large ill-defined irregular hypoechoic mass 1 o'clock position measuring 3.0 x 2.5 x 1.8 cm.  Biopsy was recommended and was performed on 12/14/2009.  This was felt to be an invasive ductal cancer, ER/PR positive 99% and 95% respectively, proliferative index 8%, HER-2 was non amplified.  MRI scan performed on the left breast 12/20/2009 showed a mass measuring 2.1 x 2.1 x 2.5 cm in the left breast.  In the upper inner quadrant of the right breast there is an ill-defined lung mass enhancement measuring 2.5 x 2.3 cm.  No ultrasound correlate was identified for this lesion on the left and so an MRI-guided biopsy was scheduled.  The MRI guided biopsy of the right breast was performed on 12/27/09 resulting invasive ductal carcinoma with ductal carcinoma in situ.  She had a CT scan and PET on 01/02/10 which revealed a 4 mm lung nodule with no evidence of metastasis.  She began neoadjuvant chemotherapy beginning with dose dense FEC for 4 cycles from 01/11/10 to 02/22/10 followed by 1 cycle of dose dense Taxotere with poor tolerance and development of PPE.  She was then switched to Abraxane 3 weeks on with one week off for 2 cycles from 03/22/10 to 04/26/10.  On 06/27/10, she underwent bilateral simple mastectomies with bilateral sentinel lymph node dissection and expander placement with final pathology resulting right-sided ypTis, ypN0, ER/PR  positive, HER-2 negative breast cancer and left sided ypT2, ypN1 invasive ductal cancer.  She underwent radiation therapy under the care of Dr. Pablo Ledger from 09/10/10 to 11/02/10.  She was started on Tamoxifen in February 2012 and has tolerated it well since.  INTERVAL HISTORY:   Sandra [name] returns today for follow up of her breast cancer. She has been on tamoxifen since February 2012 and is tolerating this drug relatively well. She has significant hot flashes that were not managed well on venlafaxine. They occur day and night, but they tend to wake her on some of the stronger days. The interval history is generally unremarkable. She continues to walk a few days a week for exercise.   REVIEW OF SYSTEMS: Sandra [name] denies fevers, chills, nausea, vomiting, or changes in bowel or bladder habits. She has no shortness of breath, chest pain, cough, or palpitations. She has some sharp pains and pulling to her left chest wall, but she understands this to be normal after her type of surgery. A detailed review of systems is otherwise completely negative.   PAST MEDICAL HISTORY: Past Medical History  Diagnosis Date  . Cancer     breast  . GERD (gastroesophageal reflux disease)   . Chicken pox   . History of blood transfusion     PAST SURGICAL HISTORY: Past Surgical History  Procedure Laterality Date  . Tonsillectomy  1960s  . Mastectomy  9/30 2011    bilat.  Marland Kitchen Axillary lymph node dissection  08/06/2010    left   . Breast reconstruction  08/15/2011    Procedure: BREAST RECONSTRUCTION;  Surgeon: Theodoro Kos, DO;  Location: [place];  Service: Plastics;  Laterality: Bilateral;  bilateral breast implant exchange  . Breast reconstruction  10/04/2011    Procedure: BREAST RECONSTRUCTION;  Surgeon: Theodoro Kos, DO;  Location: [place];  Service: Plastics;  Laterality: Left;  removal left breast implant with exchange for new implant    FAMILY HISTORY Family History  Problem  Relation Age of Onset  . Anesthesia problems Mother     Nausea  . Heart disease Father   . Heart disease Maternal Grandfather   . Diabetes Paternal Grandfather     GYNECOLOGIC HISTORY:  G0, P0, menarche age 58.  She has a history of fibroids.  No history of hormone replacement therapy.  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       ADVANCED DIRECTIVES:  Living will and Healthcare POA  HEALTH MAINTENANCE: History  Substance Use Topics  . Smoking status: Never Smoker   . Smokeless tobacco: Never Used  . Alcohol Use: Yes     Comment: mild     Colonoscopy:  To schedule  PAP:  08/07/2012  Bone density:  09/02/12 resulting low bone mass  Lipid panel:  08/07/12  Allergies  Allergen Reactions  . Ibuprofen Nausea And Vomiting and Other (See Comments)    Becomes incoherent    Current Outpatient Prescriptions  Medication Sig Dispense Refill  . Ascorbic Acid (VITAMIN C) 1000 MG tablet Take 1,000 mg by mouth daily.    . B Complex-C-E-Zn (BEC/ZINC PO) Take 1 tablet by mouth daily.    . calcium carbonate (OS-CAL) 600 MG TABS Take 600 mg by mouth daily.      . cholecalciferol (VITAMIN D) 400 UNITS TABS Take by mouth daily.      . fish oil-omega-3 fatty acids 1000 MG capsule Take 2 g by mouth daily.      . tamoxifen (NOLVADEX) 20 MG tablet Take one tablet by mouth one time daily 30 tablet 10  . gabapentin (NEURONTIN) 100 MG capsule Take 1 capsule (100 mg total) by mouth 3 (three) times daily. 120 capsule 3  . glucosamine-chondroitin 500-400 MG tablet Take 1 tablet by mouth daily.     No current facility-administered medications for this visit.    OBJECTIVE: Middle-aged white woman who appears well Filed Vitals:   01/20/15 1417  BP: 115/58  Pulse: 70  Temp: 97.8 F (36.6 C)  Resp: 18     Body mass index is 23.48 kg/(m^2).    ECOG FS:  Asymptomatic  Skin: warm, dry  HEENT: sclerae anicteric, conjunctivae pink, oropharynx clear. No thrush or  mucositis.  Lymph Nodes: No cervical or supraclavicular lymphadenopathy  Lungs: clear to auscultation bilaterally, no rales, wheezes, or rhonci  Heart: regular rate and rhythm  Abdomen: round, soft, non tender, positive bowel sounds  Musculoskeletal: No focal spinal tenderness, no peripheral edema  Neuro: non focal, well oriented, positive affect  Breast: bilateral breasts status post mastectomies. No evidence of recurrent disease. Bilateral axillae benign.   LAB RESULTS: Lab Results  Component Value Date   WBC 5.7 01/13/2015   NEUTROABS 4.4 01/13/2015   HGB 12.9 01/13/2015   HCT 39.1 01/13/2015   MCV 93.3 01/13/2015   PLT 201 01/13/2015      Chemistry      Component Value Date/Time   NA 143 01/13/2015 0803   NA 142 01/21/2014 1625   K  3.9 01/13/2015 0803   K 4.0 01/21/2014 1625   CL 104 01/21/2014 1625   CL 105 01/11/2013 0937   CO2 27 01/13/2015 0803   CO2 25 01/21/2014 1625   BUN 17.5 01/13/2015 0803   BUN 13 01/21/2014 1625   CREATININE 0.7 01/13/2015 0803   CREATININE 0.75 01/21/2014 1625   CREATININE 0.69 12/09/2011 1503      Component Value Date/Time   CALCIUM 8.2* 01/13/2015 0803   CALCIUM 9.2 01/21/2014 1625   ALKPHOS 47 01/13/2015 0803   ALKPHOS 39 01/21/2014 1625   AST 27 01/13/2015 0803   AST 24 01/21/2014 1625   ALT 30 01/13/2015 0803   ALT 23 01/21/2014 1625   BILITOT 0.46 01/13/2015 0803   BILITOT 0.4 01/21/2014 1625       Lab Results  Component Value Date   LABCA2 22 06/12/2012    No components found for: ZOXWR604  No results for input(s): INR in the last 168 hours.  Urinalysis No results found for: COLORURINE  STUDIES: No results found.  ASSESSMENT: 59 y.o. [place] woman: #1  S/P biopsy of the left breast in 11/2009 for a clinical T2 N0 IDC, ER 99%, PR 95%, HER2-, Ki67 8% and biopsy of the right breast on 12/27/09 for IDC with DCIS, ER 98%, PR 100%, Ki67 8%, HER2-.  #2  She began neoadjuvant chemotherapy beginning with dose  dense FEC for 4 cycles from 01/11/10 to 02/22/10 followed by 1 cycle of dose dense Taxotere with poor tolerance and development of PPE.  She was then switched to Abraxane 3 weeks on with one week off for 2 cycles from 03/22/10 to 04/26/10.    #3  On 06/27/10, she underwent bilateral simple mastectomies with bilateral sentinel lymph node dissection and expander placement with final pathology resulting right-sided ypTis, ypN0, ER/PR positive, HER-2 negative breast cancer and left sided ypT2, ypN1 invasive ductal cancer.    #4  She underwent radiation therapy under the care of Dr. Pablo Ledger from 09/10/10 to 11/02/10.    #5  She was started on Tamoxifen in February 2012 and has tolerated it well since.  PLAN:  Sandra [name] is doing well as far as her breast cancer is concerned. She is now 3.5 years out from her definitive surgery with no evidence of recurrent disease. She is tolerating the tamoxifen well and will continue this drug for a total of 10 years of antiestrogen therapy.   For her hot flashes, she is going to try gabapentin, 372m at night and 1011min the morning. If the drug does not make her significantly tired during the day, she is welcome to increase the daytime dose to 200 or 30038m  LynMarignyll return in 1 year for labs and a follow up visit. She requested a repeat bone density scan and I was happy to order this for her. Her last one was in late 2013 and showed osteopenia. She understands that tamoxifen helps strengthens the bone rather than deplete it. She understand and agrees with this plan. She knows the goal of treatment in her case is cure. She has been encouraged to call with any issues that might arise before her next visit here.  HeaGenelle [name]    01/20/2015

## 2015-01-20 NOTE — Telephone Encounter (Signed)
Called and left a message with the one year appointment

## 2015-01-20 NOTE — Addendum Note (Signed)
Addended by: Marcelino Duster on: 01/20/2015 05:56 PM   Modules accepted: Orders

## 2015-04-11 ENCOUNTER — Encounter: Payer: Self-pay | Admitting: Genetic Counselor

## 2015-04-16 ENCOUNTER — Other Ambulatory Visit: Payer: Self-pay | Admitting: Oncology

## 2015-04-16 DIAGNOSIS — C50919 Malignant neoplasm of unspecified site of unspecified female breast: Secondary | ICD-10-CM

## 2015-04-17 ENCOUNTER — Telehealth: Payer: Self-pay | Admitting: *Deleted

## 2015-04-17 NOTE — Telephone Encounter (Signed)
Pharmacist called on behalf of patient.  "She has one tamoxifen left.  Is very anxious because she thought a refill was sent in when she was seen in April."  Refills authorized to get her through to next F/U in April 2017.

## 2015-04-26 ENCOUNTER — Telehealth: Payer: Self-pay | Admitting: Oncology

## 2015-04-26 NOTE — Telephone Encounter (Signed)
Left message to confirm appointment change due to MD schedule. Appointment moved from 01/22/16 to 01/25/16, mailed calendar

## 2015-07-06 ENCOUNTER — Encounter: Payer: Self-pay | Admitting: Family Medicine

## 2015-07-06 ENCOUNTER — Ambulatory Visit (INDEPENDENT_AMBULATORY_CARE_PROVIDER_SITE_OTHER): Payer: 59 | Admitting: Family Medicine

## 2015-07-06 VITALS — BP 120/84 | HR 80 | Temp 97.9°F | Resp 17 | Wt 152.1 lb

## 2015-07-06 DIAGNOSIS — J0100 Acute maxillary sinusitis, unspecified: Secondary | ICD-10-CM | POA: Diagnosis not present

## 2015-07-06 MED ORDER — AMOXICILLIN 875 MG PO TABS: 875.0000 mg | ORAL_TABLET | Freq: Two times a day (BID) | ORAL | Status: DC

## 2015-07-06 NOTE — Patient Instructions (Signed)
Follow up as needed Start the Amoxicillin twice daily w/ food- 1st dose tonight w/ dinner Drink plenty of fluids REST!!! Add OTC Claritin or Zyrtec daily (store brand generic work just as well!) Call with any questions or concerns Hang in there!!!

## 2015-07-06 NOTE — Progress Notes (Signed)
   Subjective:    Patient ID: Sandra [name], female    DOB: 1956/07/09, 59 y.o.   MRN: [id number]  HPI URI- sxs started ~6 weeks ago w/ nasal congestion and sinu drainage.  10 days ago sxs dramatically worsened.  + facial pain/pressure.  + tooth pain.  No current nausea but 2 weeks ago had intermittent sxs.  Fever and chills earlier in illness.  Minimal cough.  + hoarseness.  Bilateral ear pain.   Review of Systems For ROS see HPI     Objective:   Physical Exam  Constitutional: She appears well-developed and well-nourished. No distress.  HENT:  Head: Normocephalic and atraumatic.  Right Ear: Tympanic membrane normal.  Left Ear: Tympanic membrane normal.  Nose: Mucosal edema and rhinorrhea present. Right sinus exhibits maxillary sinus tenderness and frontal sinus tenderness. Left sinus exhibits maxillary sinus tenderness and frontal sinus tenderness.  Mouth/Throat: Uvula is midline and mucous membranes are normal. Posterior oropharyngeal erythema present. No oropharyngeal exudate.  Eyes: Conjunctivae and EOM are normal. Pupils are equal, round, and reactive to light.  Neck: Normal range of motion. Neck supple.  Cardiovascular: Normal rate, regular rhythm and normal heart sounds.   Pulmonary/Chest: Effort normal and breath sounds normal. No respiratory distress. She has no wheezes.  Lymphadenopathy:    She has no cervical adenopathy.  Vitals reviewed.         Assessment & Plan:

## 2015-07-06 NOTE — Assessment & Plan Note (Signed)
Pt's sxs and PE consistent w/ infxn.  Start abx.  OTC antihistamine daily to decrease allergy congestion.  Reviewed supportive care and red flags that should prompt return.  Pt expressed understanding and is in agreement w/ plan.

## 2015-07-06 NOTE — Progress Notes (Signed)
Pre visit review using our clinic review tool, if applicable. No additional management support is needed unless otherwise documented below in the visit note. 

## 2015-11-07 ENCOUNTER — Encounter: Payer: Self-pay | Admitting: Medical

## 2015-11-07 ENCOUNTER — Ambulatory Visit (INDEPENDENT_AMBULATORY_CARE_PROVIDER_SITE_OTHER): Payer: Managed Care, Other (non HMO) | Admitting: Medical

## 2015-11-07 VITALS — BP 120/80 | HR 63 | Temp 98.1°F | Ht 68.0 in | Wt 151.0 lb

## 2015-11-07 DIAGNOSIS — J0100 Acute maxillary sinusitis, unspecified: Secondary | ICD-10-CM

## 2015-11-07 DIAGNOSIS — H6983 Other specified disorders of Eustachian tube, bilateral: Secondary | ICD-10-CM

## 2015-11-07 MED ORDER — CEFDINIR 300 MG PO CAPS: 300.0000 mg | ORAL_CAPSULE | Freq: Two times a day (BID) | ORAL | Status: DC

## 2015-11-07 MED ORDER — FLUTICASONE PROPIONATE 50 MCG/ACT NA SUSP: 2.0000 | Freq: Every day | NASAL | Status: DC

## 2015-11-07 NOTE — Patient Instructions (Addendum)
You appear to have a sinus infection. I am prescribing cefdnir antibiotic for the infection. To help with the nasal congestion I prescribed  flonase nasal steroid.   Rest, hydrate, tylenol for fever.  Follow up in 7 days or as needed.

## 2015-11-07 NOTE — Progress Notes (Signed)
Subjective:    Patient ID: Sandra [name], female    DOB: 05-18-1956, 60 y.o.   MRN: [id number]  HPI  1 wk ago got some pressure in ears as if in plane. Then got head congestion and nasal congestion. Pt sinus are hurting. Ears feel like may get infected/pressure. Pt early on fevers chills or sweats. Prior week to illness had been traveling.  Pt tried some otc alka-selter, nyquil and airborne.     Review of Systems  Constitutional: Positive for fever, chills and fatigue.       Early on.  HENT: Positive for congestion, ear pain, postnasal drip, sinus pressure and sneezing.        Sneezing some early on.  Respiratory: Negative for cough, choking and wheezing.   Cardiovascular: Negative for chest pain and palpitations.  Musculoskeletal: Negative for myalgias.  Neurological: Negative for dizziness and headaches.  Hematological: Negative for adenopathy. Does not bruise/bleed easily.  Psychiatric/Behavioral: Negative for behavioral problems and confusion.   Past Medical History  Diagnosis Date  . Cancer (HCC)     breast  . GERD (gastroesophageal reflux disease)   . Chicken pox   . History of blood transfusion     Social History   Social History  . Marital Status: Single    Spouse Name: N/A  . Number of Children: N/A  . Years of Education: N/A   Occupational History  . Not on file.   Social History Main Topics  . Smoking status: Never Smoker   . Smokeless tobacco: Never Used  . Alcohol Use: Yes     Comment: mild  . Drug Use: No  . Sexual Activity: Yes    Birth Control/ Protection: Post-menopausal   Other Topics Concern  . Not on file   Social History Narrative    Past Surgical History  Procedure Laterality Date  . Tonsillectomy  1960s  . Mastectomy  9/30 2011    bilat.  Marland Kitchen Axillary lymph node dissection  08/06/2010    left   . Breast reconstruction  08/15/2011    Procedure: BREAST RECONSTRUCTION;  Surgeon: Theodoro Kos, DO;  Location: [place];  Service: Plastics;  Laterality: Bilateral;  bilateral breast implant exchange  . Breast reconstruction  10/04/2011    Procedure: BREAST RECONSTRUCTION;  Surgeon: Theodoro Kos, DO;  Location: [place];  Service: Plastics;  Laterality: Left;  removal left breast implant with exchange for new implant    Family History  Problem Relation Age of Onset  . Anesthesia problems Mother     Nausea  . Heart disease Father   . Heart disease Maternal Grandfather   . Diabetes Paternal Grandfather     Allergies  Allergen Reactions  . Ibuprofen Nausea And Vomiting and Other (See Comments)    Becomes incoherent    Current Outpatient Prescriptions on File Prior to Visit  Medication Sig Dispense Refill  . Ascorbic Acid (VITAMIN C) 1000 MG tablet Take 1,000 mg by mouth daily.    . B Complex-C-E-Zn (BEC/ZINC PO) Take 1 tablet by mouth daily.    . calcium carbonate (OS-CAL) 600 MG TABS Take 600 mg by mouth daily.      . cholecalciferol (VITAMIN D) 400 UNITS TABS Take by mouth daily.      . fish oil-omega-3 fatty acids 1000 MG capsule Take 2 g by mouth daily.      Marland Kitchen glucosamine-chondroitin 500-400 MG tablet Take 1 tablet by mouth daily.    . tamoxifen (  NOLVADEX) 20 MG tablet TAKE ONE TABLET BY MOUTH ONE TIME DAILY 30 tablet 9   No current facility-administered medications on file prior to visit.    BP 120/80 mmHg  Pulse 63  Temp(Src) 98.1 F (36.7 C) (Oral)  Ht 5\' 8"  (1.727 m)  Wt 151 lb (68.493 kg)  BMI 22.96 kg/m2  SpO2 98%  LMP 11/28/2008       Objective:   Physical Exam  General  Mental Status - Alert. General Appearance - Well groomed. Not in acute distress.  Skin Rashes- No Rashes.  HEENT Head- Normal. Ear Auditory Canal - Left- Normal. Right - Normal.Tympanic Membrane- Left- Normal. Right- Normal. Eye Sclera/Conjunctiva- Left- Normal. Right- Normal. Nose & Sinuses Nasal Mucosa- Left-  Boggy and Congested. Right-  Boggy and  Congested.Bilateral  maxillary pressure but frontal no sinus pressure. Mouth & Throat Lips: Upper Lip- Normal: no dryness, cracking, pallor, cyanosis, or vesicular eruption. Lower Lip-Normal: no dryness, cracking, pallor, cyanosis or vesicular eruption. Buccal Mucosa- Bilateral- No Aphthous ulcers. Oropharynx- No Discharge or Erythema. Tonsils: Characteristics- Bilateral- No Erythema or Congestion. Size/Enlargement- Bilateral- No enlargement. Discharge- bilateral-None.  Neck Neck- Supple. No Masses.   Chest and Lung Exam Auscultation: Breath Sounds:-Clear even and unlabored.  Cardiovascular Auscultation:Rythm- Regular, rate and rhythm. Murmurs & Other Heart Sounds:Ausculatation of the heart reveal- No Murmurs.  Lymphatic Head & Neck General Head & Neck Lymphatics: Bilateral: Description- No Localized lymphadenopathy.       Assessment & Plan:  You appear to have a sinus infection. I am prescribing cefdnir antibiotic for the infection. To help with the nasal congestion I prescribed  flonase nasal steroid.   Rest, hydrate, tylenol for fever.  Follow up in 7 days or as needed.

## 2015-11-07 NOTE — Progress Notes (Signed)
Pre visit review using our clinic review tool, if applicable. No additional management support is needed unless otherwise documented below in the visit note. 

## 2016-01-16 ENCOUNTER — Ambulatory Visit
Admission: RE | Admit: 2016-01-16 | Discharge: 2016-01-16 | Disposition: A | Discharge status: Home / Self Care | Payer: Managed Care, Other (non HMO) | Source: Ambulatory Visit | Attending: Nurse Practitioner | Admitting: Nurse Practitioner

## 2016-01-16 DIAGNOSIS — M8580 Other specified disorders of bone density and structure, unspecified site: Secondary | ICD-10-CM

## 2016-01-22 ENCOUNTER — Other Ambulatory Visit: Payer: 59

## 2016-01-22 ENCOUNTER — Ambulatory Visit: Payer: 59 | Admitting: Oncology

## 2016-01-25 ENCOUNTER — Telehealth: Payer: Self-pay | Admitting: Oncology

## 2016-01-25 ENCOUNTER — Ambulatory Visit (HOSPITAL_BASED_OUTPATIENT_CLINIC_OR_DEPARTMENT_OTHER): Payer: Managed Care, Other (non HMO) | Admitting: Oncology

## 2016-01-25 ENCOUNTER — Other Ambulatory Visit (HOSPITAL_BASED_OUTPATIENT_CLINIC_OR_DEPARTMENT_OTHER): Payer: Managed Care, Other (non HMO)

## 2016-01-25 VITALS — BP 116/65 | HR 85 | Temp 98.1°F | Resp 18 | Ht 68.0 in | Wt 153.0 lb

## 2016-01-25 DIAGNOSIS — C50211 Malignant neoplasm of upper-inner quadrant of right female breast: Secondary | ICD-10-CM

## 2016-01-25 DIAGNOSIS — C50919 Malignant neoplasm of unspecified site of unspecified female breast: Secondary | ICD-10-CM | POA: Diagnosis not present

## 2016-01-25 DIAGNOSIS — C50412 Malignant neoplasm of upper-outer quadrant of left female breast: Secondary | ICD-10-CM

## 2016-01-25 LAB — COMPREHENSIVE METABOLIC PANEL
ALBUMIN: 4 g/dL (ref 3.5–5.0)
ALT: 22 U/L (ref 0–55)
ANION GAP: 6 meq/L (ref 3–11)
AST: 23 U/L (ref 5–34)
Alkaline Phosphatase: 45 U/L (ref 40–150)
BILIRUBIN TOTAL: 0.39 mg/dL (ref 0.20–1.20)
BUN: 17.8 mg/dL (ref 7.0–26.0)
CALCIUM: 9.2 mg/dL (ref 8.4–10.4)
CO2: 29 mEq/L (ref 22–29)
CREATININE: 0.7 mg/dL (ref 0.6–1.1)
Chloride: 106 mEq/L (ref 98–109)
Glucose: 92 mg/dl (ref 70–140)
Potassium: 3.8 mEq/L (ref 3.5–5.1)
Sodium: 141 mEq/L (ref 136–145)
TOTAL PROTEIN: 6.4 g/dL (ref 6.4–8.3)

## 2016-01-25 LAB — CBC WITH DIFFERENTIAL/PLATELET
BASO%: 0.4 % (ref 0.0–2.0)
Basophils Absolute: 0 10*3/uL (ref 0.0–0.1)
EOS ABS: 0.1 10*3/uL (ref 0.0–0.5)
EOS%: 1.3 % (ref 0.0–7.0)
HCT: 37.3 % (ref 34.8–46.6)
HEMOGLOBIN: 12.8 g/dL (ref 11.6–15.9)
LYMPH#: 1.1 10*3/uL (ref 0.9–3.3)
LYMPH%: 20.2 % (ref 14.0–49.7)
MCH: 31.4 pg (ref 25.1–34.0)
MCHC: 34.3 g/dL (ref 31.5–36.0)
MCV: 91.6 fL (ref 79.5–101.0)
MONO#: 0.5 10*3/uL (ref 0.1–0.9)
MONO%: 10.2 % (ref 0.0–14.0)
NEUT#: 3.6 10*3/uL (ref 1.5–6.5)
NEUT%: 67.9 % (ref 38.4–76.8)
PLATELETS: 190 10*3/uL (ref 145–400)
RBC: 4.07 10*6/uL (ref 3.70–5.45)
RDW: 12.9 % (ref 11.2–14.5)
WBC: 5.3 10*3/uL (ref 3.9–10.3)

## 2016-01-25 MED ORDER — VENLAFAXINE HCL ER 75 MG PO CP24: 75.0000 mg | ORAL_CAPSULE | Freq: Every day | ORAL | Status: DC

## 2016-01-25 MED ORDER — TAMOXIFEN CITRATE 20 MG PO TABS: 20.0000 mg | ORAL_TABLET | Freq: Every day | ORAL | Status: DC

## 2016-01-25 NOTE — Progress Notes (Signed)
ID: MARIYANA FULOP   DOB: [birth year]  MR#: [id number]  JKK#:[id number]  PCP: Annye Asa, MD GYN:  Uses PCP SU: Dr. Margot Chimes Other: Dr. Migdalia Dk with Plastic Surgery  CHIEF COMPLAINT: bilateral breast cancers  CURRENT TREATMENT: tamoxifen daily  BREAST CANCER HISTORY    Sandra [name] is a 60 year old [place] woman, who had a screening mammogram performed on 12/14/2009 which showed a spiculated mass 1 o'clock position.  Physical exam at that time showed a large firm palpable mass upper aspect left breast at 1 o'clock.  No palpable lymph nodes were seen.  Ultrasound showed a large ill-defined irregular hypoechoic mass 1 o'clock position measuring 3.0 x 2.5 x 1.8 cm.  Biopsy was recommended and was performed on 12/14/2009.  This was felt to be an invasive ductal cancer, ER/PR positive 99% and 95% respectively, proliferative index 8%, HER-2 was non amplified.  MRI scan performed on the left breast 12/20/2009 showed a mass measuring 2.1 x 2.1 x 2.5 cm in the left breast.  In the upper inner quadrant of the right breast there is an ill-defined lung mass enhancement measuring 2.5 x 2.3 cm.  No ultrasound correlate was identified for this lesion on the left and so an MRI-guided biopsy was scheduled.  The MRI guided biopsy of the right breast was performed on 12/27/09 resulting invasive ductal carcinoma with ductal carcinoma in situ.  She had a CT scan and PET on 01/02/10 which revealed a 4 mm lung nodule with no evidence of metastasis.  She began neoadjuvant chemotherapy beginning with dose dense FEC for 4 cycles from 01/11/10 to 02/22/10 followed by 1 cycle of dose dense Taxotere with poor tolerance and development of PPE.  She was then switched to Abraxane 3 weeks on with one week off for 2 cycles from 03/22/10 to 04/26/10.  On 06/27/10, she underwent bilateral simple mastectomies with bilateral sentinel lymph node dissection and expander placement with final pathology resulting right-sided ypTis, ypN0, ER/PR  positive, HER-2 negative breast cancer and left sided ypT2, ypN1 invasive ductal cancer.  She underwent radiation therapy under the care of Dr. Pablo Ledger from 09/10/10 to 11/02/10.  She was started on Tamoxifen in February 2012 and has tolerated [name] well since.  INTERVAL HISTORY:   Sandra [name] returns today for follow up of her bilateral breast cancers. She continues on tamoxifen, which she currently obtains free of charge. She tolerates [name] generally well except for the hot flashes. These are becoming more problem now that the weather is warming up area  REVIEW OF SYSTEMS: Sandra [name] is experiencing quite a bit of stress at work because of very long hours. She also has to drive a long way to get there and back. This leaves her with no time to exercise. She normally enjoys kayaking walking but is not able to do this. She does not belong to a gym. Aside from these issues a detailed review of systems today was noncontributory  PAST MEDICAL HISTORY: Past Medical History  Diagnosis Date  . Cancer (HCC)     breast  . GERD (gastroesophageal reflux disease)   . Chicken pox   . History of blood transfusion     PAST SURGICAL HISTORY: Past Surgical History  Procedure Laterality Date  . Tonsillectomy  1960s  . Mastectomy  9/30 2011    bilat.  Marland Kitchen Axillary lymph node dissection  08/06/2010    left   . Breast reconstruction  08/15/2011    Procedure: BREAST RECONSTRUCTION;  Surgeon: Theodoro Kos, DO;  Location: [facility];  Service: Plastics;  Laterality: Bilateral;  bilateral breast implant exchange  . Breast reconstruction  10/04/2011    Procedure: BREAST RECONSTRUCTION;  Surgeon: Theodoro Kos, DO;  Location: [facility];  Service: Plastics;  Laterality: Left;  removal left breast implant with exchange for new implant    FAMILY HISTORY Family History  Problem Relation Age of Onset  . Anesthesia problems Mother     Nausea  . Heart disease Father   . Heart disease Maternal  Grandfather   . Diabetes Paternal Grandfather     GYNECOLOGIC HISTORY:  G0, P0, menarche age 42.  She has a history of fibroids.  No history of hormone replacement therapy.  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       ADVANCED DIRECTIVES:  Living will and Healthcare POA In place  HEALTH MAINTENANCE: Social History  Substance Use Topics  . Smoking status: Never Smoker   . Smokeless tobacco: Never Used  . Alcohol Use: Yes     Comment: mild     Colonoscopy:    PAP:  08/07/2012  Bone density:  09/02/12 resulting low bone mass  Lipid panel:  08/07/12  Allergies  Allergen Reactions  . Ibuprofen Nausea And Vomiting and Other (See Comments)    Becomes incoherent    Current Outpatient Prescriptions  Medication Sig Dispense Refill  . Ascorbic Acid (VITAMIN C) 1000 MG tablet Take 1,000 mg by mouth daily.    . B Complex-C-E-Zn (BEC/ZINC PO) Take 1 tablet by mouth daily.    . calcium carbonate (OS-CAL) 600 MG TABS Take 600 mg by mouth daily.      . cefdinir (OMNICEF) 300 MG capsule Take 1 capsule (300 mg total) by mouth 2 (two) times daily. 20 capsule 0  . cholecalciferol (VITAMIN D) 400 UNITS TABS Take by mouth daily.      . fish oil-omega-3 fatty acids 1000 MG capsule Take 2 g by mouth daily.      . fluticasone (FLONASE) 50 MCG/ACT nasal spray Place 2 sprays into both nostrils daily. 16 g 1  . glucosamine-chondroitin 500-400 MG tablet Take 1 tablet by mouth daily.    . tamoxifen (NOLVADEX) 20 MG tablet TAKE ONE TABLET BY MOUTH ONE TIME DAILY 30 tablet 9   No current facility-administered medications for this visit.    OBJECTIVE: Middle-aged white woman In no acute distress  Filed Vitals:   01/25/16 1456  BP: 116/65  Pulse: 85  Temp: 98.1 F (36.7 C)  Resp: 18     Body mass index is 23.27 kg/(m^2).      Sclerae unicteric, pupils round and equal Oropharynx clear and moist-- no thrush or other lesions No cervical or supraclavicular  adenopathy Lungs no rales or rhonchi Heart regular rate and rhythm Abd soft, nontender, positive bowel sounds MSK no focal spinal tenderness, no upper extremity lymphedema Neuro: nonfocal, well oriented, appropriate affect Breasts: Status post bilateral mastectomies with bilateral reconstruction. There is no evidence of local recurrence. Both axillae are benign. All    LAB RESULTS: Lab Results  Component Value Date   WBC 5.3 01/25/2016   NEUTROABS 3.6 01/25/2016   HGB 12.8 01/25/2016   HCT 37.3 01/25/2016   MCV 91.6 01/25/2016   PLT 190 01/25/2016      Chemistry      Component Value Date/Time   NA 143 01/13/2015 0803   NA 142 01/21/2014 1625   K 3.9 01/13/2015 0803  K 4.0 01/21/2014 1625   CL 104 01/21/2014 1625   CL 105 01/11/2013 0937   CO2 27 01/13/2015 0803   CO2 25 01/21/2014 1625   BUN 17.5 01/13/2015 0803   BUN 13 01/21/2014 1625   CREATININE 0.7 01/13/2015 0803   CREATININE 0.75 01/21/2014 1625   CREATININE 0.69 12/09/2011 1503      Component Value Date/Time   CALCIUM 8.2* 01/13/2015 0803   CALCIUM 9.2 01/21/2014 1625   ALKPHOS 47 01/13/2015 0803   ALKPHOS 39 01/21/2014 1625   AST 27 01/13/2015 0803   AST 24 01/21/2014 1625   ALT 30 01/13/2015 0803   ALT 23 01/21/2014 1625   BILITOT 0.46 01/13/2015 0803   BILITOT 0.4 01/21/2014 1625       Lab Results  Component Value Date   LABCA2 22 06/12/2012    No components found for: [id number]  No results for input(s): INR in the last 168 hours.  Urinalysis No results found for: COLORURINE  STUDIES: Dg Bone Density  01/16/2016  EXAM: DUAL X-RAY ABSORPTIOMETRY (DXA) FOR BONE MINERAL DENSITY IMPRESSION: Referring Physician:  Annye Asa PATIENT: Name: Sandra [name], Sandra [name] Patient ID: [id number] Birth Date: 09/02/1956 Height: 67.5 in. Sex: Female Measured: 01/16/2016 Weight: 155.0 lbs. Indications: Breast Cancer History, Caucasian, Estrogen Deficient, History of Osteopenia, Low Calcium Intake (269.3), Nexium,  Postmenopausal, Tamoxifen, Vitamin D Deficient Fractures: None Treatments: Calcium (E943.0), Vitamin D (E933.5) ASSESSMENT: The BMD measured at Femur Total Left is 0.771 g/cm2 with a T-score of -1.9. This patient is considered OSTEOPENIC according to [facility]) criteria. There has been no statistically significant change in BMD of lumbar spine since prior exam dated 08/03/2012. There has been a statistically significant decrease in BMD of left hip since prior exam dated 08/03/2012. Site Region Measured Date Measured Age YA BMD Significant CHANGE T-score DualFemur Total Left 01/16/2016 58.3 -1.9 0.771 g/cm2 * AP Spine  L1-L4      2/16/2016    58.3         -1.8    0.973 g/cm2 World Health Organization [facility]) criteria for post-menopausal, Caucasian Women: Normal       T-score at or above -1 SD Osteopenia   T-score between -1 and -2.5 SD Osteoporosis T-score at or below -2.5 SD RECOMMENDATION: [place] recommends that FDA-approved medical therapies be considered in postmenopausal women and men age 42 or older with a: 1. Hip or vertebral (clinical or morphometric) fracture. 2. T-score of <-2.5 at the spine or hip. 3. Ten-year fracture probability by FRAX of 3% or greater for hip fracture or 20% or greater for major osteoporotic fracture. All treatment decisions require clinical judgment and consideration of individual patient factors, including patient preferences, co-morbidities, previous drug use, risk factors not captured in the FRAX model (e.g. falls, vitamin D deficiency, increased bone turnover, interval significant decline in bone density) and possible under - or over-estimation of fracture risk by FRAX. All patients should ensure an adequate intake of dietary calcium (1200 mg/d) and vitamin D (800 IU daily) unless contraindicated. FOLLOW-UP: People with diagnosed cases of osteoporosis or at high risk for fracture should have regular bone mineral density tests. For  patients eligible for Medicare, routine testing is allowed once every 2 years. The testing frequency can be increased to one year for patients who have rapidly progressing disease, those who are receiving or discontinuing medical therapy to restore bone mass, or have additional risk factors. FRAX* 10-year Probability of Fracture Based on femoral neck BMD: DualFemur (Left)  Major Osteoporotic Fracture: 7.9% Hip Fracture:                0.8% Population:                  [place] (Caucasian) Risk Factors:                None *FRAX is a Materials engineer of the [employer] of [employer] for Metabolic Bone Disease, a [place] (WHO) [facility]. ASSESSMENT: The probability of a major osteoporotic fracture is 7.9 % within the next ten years. The probability of a hip fracture is 0.8% within the next ten years. I have reviewed this report and agree with the above findings. Mark A. Thornton Papas, M.D. [facility] Radiology Electronically Signed   By: Lavonia Dana M.D.   On: 01/16/2016 09:08    ASSESSMENT: 60 y.o. [place] woman: #1  S/P biopsy of the left breast in 11/2009 for a clinical T2 N0 IDC, ER 99%, PR 95%, HER2-, Ki67 8% and biopsy of the right breast on 12/27/09 for IDC with DCIS, ER 98%, PR 100%, Ki67 8%, HER2-.  #2  She began neoadjuvant chemotherapy beginning with dose dense FEC for 4 cycles from 01/11/10 to 02/22/10 followed by 1 cycle of dose dense Taxotere with poor tolerance and development of PPE.  She was then switched to Abraxane 3 weeks on with one week off for 2 cycles from 03/22/10 to 04/26/10.    #3  On 06/27/10, she underwent bilateral simple mastectomies with bilateral sentinel lymph node dissection and expander placement with final pathology resulting right-sided ypTis, ypN0, ER/PR positive, HER-2 negative breast cancer and left sided ypT2, ypN1 invasive ductal cancer.    #4  She underwent radiation therapy under the care of Dr. Pablo Ledger from 09/10/10 to 11/02/10.     #5  She was started on Tamoxifen in February 2012 and has tolerated [name] well since.  PLAN:  Sandra [name] is now 5-1/2 years out from definitive surgery with no evidence of disease recurrence. This is very favorable.  She is tolerating tamoxifen well and the plan is to continue that for a total of 10 years.  I think she would benefit from venlafaxine every 5 mg for her hot flashes. We discussed the possible toxicities, side effects and complications of this agent and I went ahead and placed a prescription for her. She will call if [name] causes her any problems.  She is 8 years behind on her colonoscopy. She agrees to having 1. I have placed that referral.  Otherwise she will see me again in one year. She knows to call for any other problems that may develop before the next visit.  Annai Heick C    01/25/2016

## 2016-01-25 NOTE — Telephone Encounter (Signed)
Gave and printed appt sched and avs for pt for April ...lvm for Lattie Haw the schedular at Dr. Collene Mares office

## 2016-01-31 ENCOUNTER — Telehealth: Payer: Self-pay | Admitting: Oncology

## 2016-01-31 NOTE — Telephone Encounter (Signed)
Faxed records to Dr. Mann [phone] °

## 2016-09-03 ENCOUNTER — Encounter: Payer: Self-pay | Admitting: Physician Assistant

## 2016-09-03 ENCOUNTER — Ambulatory Visit (INDEPENDENT_AMBULATORY_CARE_PROVIDER_SITE_OTHER): Payer: Managed Care, Other (non HMO) | Admitting: Physician Assistant

## 2016-09-03 VITALS — BP 110/70 | HR 59 | Temp 98.6°F | Resp 14 | Ht 68.0 in | Wt 158.0 lb

## 2016-09-03 DIAGNOSIS — B9689 Other specified bacterial agents as the cause of diseases classified elsewhere: Secondary | ICD-10-CM | POA: Diagnosis not present

## 2016-09-03 DIAGNOSIS — J019 Acute sinusitis, unspecified: Secondary | ICD-10-CM | POA: Diagnosis not present

## 2016-09-03 MED ORDER — AMOXICILLIN-POT CLAVULANATE 875-125 MG PO TABS: 1.0000 | ORAL_TABLET | Freq: Two times a day (BID) | ORAL | 0 refills | Status: DC

## 2016-09-03 MED ORDER — FLUTICASONE PROPIONATE 50 MCG/ACT NA SUSP: 2.0000 | Freq: Every day | NASAL | 2 refills | Status: AC

## 2016-09-03 NOTE — Patient Instructions (Signed)
Please take antibiotic as directed.  Increase fluid intake.  Use Saline nasal spray.  Take a daily multivitamin. Start the [facility] and take as directed. Continue your OTC medications.  Place a humidifier in the bedroom.  Please call or return clinic if symptoms are not improving.  Sinusitis Sinusitis is redness, soreness, and swelling (inflammation) of the paranasal sinuses. Paranasal sinuses are air pockets within the bones of your face (beneath the eyes, the middle of the forehead, or above the eyes). In healthy paranasal sinuses, mucus is able to drain out, and air is able to circulate through them by way of your nose. However, when your paranasal sinuses are inflamed, mucus and air can become trapped. This can allow bacteria and other germs to grow and cause infection. Sinusitis can develop quickly and last only a short time (acute) or continue over a long period (chronic). Sinusitis that lasts for more than 12 weeks is considered chronic.  CAUSES  Causes of sinusitis include:  Allergies.  Structural abnormalities, such as displacement of the cartilage that separates your nostrils (deviated septum), which can decrease the air flow through your nose and sinuses and affect sinus drainage.  Functional abnormalities, such as when the small hairs (cilia) that line your sinuses and help remove mucus do not work properly or are not present. SYMPTOMS  Symptoms of acute and chronic sinusitis are the same. The primary symptoms are pain and pressure around the affected sinuses. Other symptoms include:  Upper toothache.  Earache.  Headache.  Bad breath.  Decreased sense of smell and taste.  A cough, which worsens when you are lying flat.  Fatigue.  Fever.  Thick drainage from your nose, which often is green and may contain pus (purulent).  Swelling and warmth over the affected sinuses. DIAGNOSIS  Your caregiver will perform a physical exam. During the exam, your caregiver may:  Look in  your nose for signs of abnormal growths in your nostrils (nasal polyps).  Tap over the affected sinus to check for signs of infection.  View the inside of your sinuses (endoscopy) with a special imaging device with a light attached (endoscope), which is inserted into your sinuses. If your caregiver suspects that you have chronic sinusitis, one or more of the following tests may be recommended:  Allergy tests.  Nasal culture A sample of mucus is taken from your nose and sent to a lab and screened for bacteria.  Nasal cytology A sample of mucus is taken from your nose and examined by your caregiver to determine if your sinusitis is related to an allergy. TREATMENT  Most cases of acute sinusitis are related to a viral infection and will resolve on their own within 10 days. Sometimes medicines are prescribed to help relieve symptoms (pain medicine, decongestants, nasal steroid sprays, or saline sprays).  However, for sinusitis related to a bacterial infection, your caregiver will prescribe antibiotic medicines. These are medicines that will help kill the bacteria causing the infection.  Rarely, sinusitis is caused by a fungal infection. In theses cases, your caregiver will prescribe antifungal medicine. For some cases of chronic sinusitis, surgery is needed. Generally, these are cases in which sinusitis recurs more than 3 times per year, despite other treatments. HOME CARE INSTRUCTIONS   Drink plenty of water. Water helps thin the mucus so your sinuses can drain more easily.  Use a humidifier.  Inhale steam 3 to 4 times a day (for example, sit in the bathroom with the shower running).  Apply a warm,  moist washcloth to your face 3 to 4 times a day, or as directed by your caregiver.  Use saline nasal sprays to help moisten and clean your sinuses.  Take over-the-counter or prescription medicines for pain, discomfort, or fever only as directed by your caregiver. SEEK IMMEDIATE MEDICAL CARE  IF:  You have increasing pain or severe headaches.  You have nausea, vomiting, or drowsiness.  You have swelling around your face.  You have vision problems.  You have a stiff neck.  You have difficulty breathing. MAKE SURE YOU:   Understand these instructions.  Will watch your condition.  Will get help right away if you are not doing well or get worse. Document Released: 09/16/2005 Document Revised: 12/09/2011 Document Reviewed: 10/01/2011 [facility] Patient Information 2014 [place], [place].

## 2016-09-03 NOTE — Progress Notes (Signed)
Pre visit review using our clinic review tool, if applicable. No additional management support is needed unless otherwise documented below in the visit note. 

## 2016-09-03 NOTE — Progress Notes (Signed)
Patient presents to clinic today several weeks of on/off nasal symptoms with recurrence over the past few days. Endorses 4 days of post-nasal drip, sinus pressure, sinus pain that is Left-sided only. Notes some L ear pressure and discomfort. Denies fever, chills. Denies chest congestion. Denies cough. Endorses some maxillary facial/tooth pain. Patient with history of seasonal allergies but not on current medication. Denies recent travel or sick contact. Patient has taken OTC Tylenol sinus with some relief of symptoms.   Past Medical History:  Diagnosis Date  . Cancer (HCC)    breast  . Chicken pox   . GERD (gastroesophageal reflux disease)   . History of blood transfusion     Current Outpatient Prescriptions on File Prior to Visit  Medication Sig Dispense Refill  . Ascorbic Acid (VITAMIN C) 1000 MG tablet Take 1,000 mg by mouth daily.    . B Complex-C-E-Zn (BEC/ZINC PO) Take 1 tablet by mouth daily.    . calcium carbonate (OS-CAL) 600 MG TABS Take 600 mg by mouth daily.      . cholecalciferol (VITAMIN D) 400 UNITS TABS Take by mouth daily.      . tamoxifen (NOLVADEX) 20 MG tablet Take 1 tablet (20 mg total) by mouth daily. 90 tablet 9  . venlafaxine XR (EFFEXOR-XR) 75 MG 24 hr capsule Take 1 capsule (75 mg total) by mouth daily with breakfast. 90 capsule 4  . fish oil-omega-3 fatty acids 1000 MG capsule Take 2 g by mouth daily.       No current facility-administered medications on file prior to visit.     Allergies  Allergen Reactions  . Ibuprofen Nausea And Vomiting and Other (See Comments)    Becomes incoherent    Family History  Problem Relation Age of Onset  . Anesthesia problems Mother     Nausea  . Heart disease Father   . Heart disease Maternal Grandfather   . Diabetes Paternal Grandfather     Social History   Social History  . Marital status: Single    Spouse name: N/A  . Number of children: N/A  . Years of education: N/A   Social History Main Topics  .  Smoking status: Never Smoker  . Smokeless tobacco: Never Used  . Alcohol use Yes     Comment: mild  . Drug use: No  . Sexual activity: Yes    Birth control/ protection: Post-menopausal   Other Topics Concern  . None   Social History Narrative  . None   Review of Systems - See HPI.  All other ROS are negative.  BP 110/70   Pulse (!) 59   Temp 98.6 F (37 C) (Oral)   Resp 14   Ht 5\' 8"  (1.727 m)   Wt 158 lb (71.7 kg)   LMP 11/28/2008   SpO2 97%   BMI 24.02 kg/m   Physical Exam  Constitutional: She is oriented to person, place, and time and well-developed, well-nourished, and in no distress.  HENT:  Head: Normocephalic and atraumatic.  Right Ear: A middle ear effusion is present.  Left Ear: Tympanic membrane normal.  Nose: Mucosal edema and rhinorrhea present. Left sinus exhibits maxillary sinus tenderness and frontal sinus tenderness.  Mouth/Throat: Uvula is midline, oropharynx is clear and moist and mucous membranes are normal.  Eyes: Conjunctivae are normal.  Neck: Neck supple.  Cardiovascular: Normal rate, regular rhythm, normal heart sounds and intact distal pulses.   Pulmonary/Chest: Effort normal and breath sounds normal. No respiratory distress. She has  no wheezes. She has no rales. She exhibits no tenderness.  Lymphadenopathy:    She has no cervical adenopathy.  Neurological: She is alert and oriented to person, place, and time.  Skin: Skin is warm and dry. No rash noted.  Psychiatric: Affect normal.  Vitals reviewed.  Assessment/Plan: 1. Acute bacterial sinusitis Rx Augmentin.  Increase fluids.  Rest.  Saline nasal spray.  Probiotic.  Mucinex as directed.  Humidifier in bedroom. Flonase as directed.  Call or return to clinic if symptoms are not improving.  - amoxicillin-clavulanate (AUGMENTIN) 875-125 MG tablet; Take 1 tablet by mouth 2 (two) times daily.  Dispense: 14 tablet; Refill: 0 - fluticasone (FLONASE) 50 MCG/ACT nasal spray; Place 2 sprays into both  nostrils daily.  Dispense: 16 g; Refill: 2   Leeanne Rio, PA-C

## 2016-12-25 ENCOUNTER — Ambulatory Visit (INDEPENDENT_AMBULATORY_CARE_PROVIDER_SITE_OTHER): Payer: No Typology Code available for payment source | Admitting: Family Medicine

## 2016-12-25 ENCOUNTER — Encounter: Payer: Self-pay | Admitting: Family Medicine

## 2016-12-25 ENCOUNTER — Other Ambulatory Visit (HOSPITAL_COMMUNITY)
Admission: RE | Admit: 2016-12-25 | Discharge: 2016-12-25 | Disposition: A | Discharge status: Home / Self Care | Payer: No Typology Code available for payment source | Source: Ambulatory Visit | Attending: Family Medicine | Admitting: Family Medicine

## 2016-12-25 VITALS — BP 100/62 | HR 72 | Temp 97.7°F | Resp 16 | Ht 67.5 in | Wt 159.0 lb

## 2016-12-25 DIAGNOSIS — Z01419 Encounter for gynecological examination (general) (routine) without abnormal findings: Secondary | ICD-10-CM | POA: Diagnosis present

## 2016-12-25 DIAGNOSIS — Z1151 Encounter for screening for human papillomavirus (HPV): Secondary | ICD-10-CM | POA: Diagnosis present

## 2016-12-25 DIAGNOSIS — Z124 Encounter for screening for malignant neoplasm of cervix: Secondary | ICD-10-CM | POA: Diagnosis not present

## 2016-12-25 DIAGNOSIS — Z0000 Encounter for general adult medical examination without abnormal findings: Secondary | ICD-10-CM | POA: Diagnosis not present

## 2016-12-25 LAB — CBC WITH DIFFERENTIAL/PLATELET
BASOS PCT: 0.7 % (ref 0.0–3.0)
Basophils Absolute: 0 10*3/uL (ref 0.0–0.1)
EOS ABS: 0.1 10*3/uL (ref 0.0–0.7)
Eosinophils Relative: 1.1 % (ref 0.0–5.0)
HCT: 40.3 % (ref 36.0–46.0)
HEMOGLOBIN: 13.5 g/dL (ref 12.0–15.0)
Lymphocytes Relative: 15.4 % (ref 12.0–46.0)
Lymphs Abs: 0.9 10*3/uL (ref 0.7–4.0)
MCHC: 33.4 g/dL (ref 30.0–36.0)
MCV: 94.5 fl (ref 78.0–100.0)
MONOS PCT: 7.6 % (ref 3.0–12.0)
Monocytes Absolute: 0.4 10*3/uL (ref 0.1–1.0)
Neutro Abs: 4.2 10*3/uL (ref 1.4–7.7)
Neutrophils Relative %: 75.2 % (ref 43.0–77.0)
Platelets: 235 10*3/uL (ref 150.0–400.0)
RBC: 4.27 Mil/uL (ref 3.87–5.11)
RDW: 13.5 % (ref 11.5–15.5)
WBC: 5.6 10*3/uL (ref 4.0–10.5)

## 2016-12-25 LAB — LIPID PANEL
Cholesterol: 200 mg/dL (ref 0–200)
HDL: 80.8 mg/dL (ref >39.00)
LDL CALC: 108 mg/dL — AB (ref 0–99)
NONHDL: 119.18
Total CHOL/HDL Ratio: 2
Triglycerides: 57 mg/dL (ref 0.0–149.0)
VLDL: 11.4 mg/dL (ref 0.0–40.0)

## 2016-12-25 LAB — BASIC METABOLIC PANEL
BUN: 18 mg/dL (ref 6–23)
CALCIUM: 9.5 mg/dL (ref 8.4–10.5)
CO2: 34 meq/L — AB (ref 19–32)
CREATININE: 0.61 mg/dL (ref 0.40–1.20)
Chloride: 101 mEq/L (ref 96–112)
GFR: 106.6 mL/min (ref >60.00)
Glucose, Bld: 103 mg/dL — ABNORMAL HIGH (ref 70–99)
Potassium: 4.4 mEq/L (ref 3.5–5.1)
Sodium: 141 mEq/L (ref 135–145)

## 2016-12-25 LAB — HEPATIC FUNCTION PANEL
ALT: 22 U/L (ref 0–35)
AST: 23 U/L (ref 0–37)
Albumin: 4.6 g/dL (ref 3.5–5.2)
Alkaline Phosphatase: 47 U/L (ref 39–117)
BILIRUBIN TOTAL: 0.4 mg/dL (ref 0.2–1.2)
Bilirubin, Direct: 0.1 mg/dL (ref 0.0–0.3)
TOTAL PROTEIN: 6.5 g/dL (ref 6.0–8.3)

## 2016-12-25 LAB — TSH: TSH: 1.38 u[IU]/mL (ref 0.35–4.50)

## 2016-12-25 NOTE — Assessment & Plan Note (Signed)
Pt's PE WNL w/ exception of known fibroids.  Due for colonoscopy- pt plans to schedule.  UTD on immunizations.  Check labs.  Anticipatory guidance provided.

## 2016-12-25 NOTE — Assessment & Plan Note (Signed)
Pap collected. 

## 2016-12-25 NOTE — Patient Instructions (Signed)
Follow up in 1 year or as needed We'll notify you of your lab results and make any changes if needed Continue to work on healthy diet and regular exercise- you look great!! Call and schedule your colonoscopy w/ Dr Collene Mares Call with any questions or concerns [place]!!!

## 2016-12-25 NOTE — Progress Notes (Signed)
   Subjective:    Patient ID: Sandra [name], female    DOB: 1956-01-19, 61 y.o.   MRN: [id number]  HPI CPE- Due for pap, colonoscopy (pt plans to see Dr Collene Mares- has paperwork).  No need for mammo due to bilateral mastectomy.  UTD on Tdap, flu.     Review of Systems Patient reports no vision/ hearing changes, adenopathy,fever, weight change,  persistant/recurrent hoarseness , swallowing issues, chest pain, palpitations, edema, persistant/recurrent cough, hemoptysis, dyspnea (rest/exertional/paroxysmal nocturnal), gastrointestinal bleeding (melena, rectal bleeding), abdominal pain, significant heartburn, bowel changes, GU symptoms (dysuria, hematuria, incontinence), Gyn symptoms (abnormal  bleeding, pain),  syncope, focal weakness, memory loss, numbness & tingling, skin/hair/nail changes, abnormal bruising or bleeding, anxiety, or depression.     Objective:   Physical Exam  General Appearance:    Alert, cooperative, no distress, appears stated age  Head:    Normocephalic, without obvious abnormality, atraumatic  Eyes:    PERRL, conjunctiva/corneas clear, EOM's intact, fundi    benign, both eyes  Ears:    Normal TM's and external ear canals, both ears  Nose:   Nares normal, septum midline, mucosa normal, no drainage    or sinus tenderness  Throat:   Lips, mucosa, and tongue normal; teeth and gums normal  Neck:   Supple, symmetrical, trachea midline, no adenopathy;    Thyroid: no enlargement/tenderness/nodules  Back:     Symmetric, no curvature, ROM normal, no CVA tenderness  Lungs:     Clear to auscultation bilaterally, respirations unlabored  Chest Wall:    No tenderness or deformity- s/p double mastectomy   Heart:    Regular rate and rhythm, S1 and S2 normal, no murmur, rub   or gallop  Breast Exam:    No tenderness, masses, or nipple abnormality  Abdomen:     Soft, non-tender, bowel sounds active all four quadrants,    no masses, no organomegaly  Genitalia:    External genitalia normal,  cervix normal in appearance, no CMT, uterus with known fibroids, adnexa w/out mass or tenderness, mucosa pink and moist, no lesions or discharge present  Rectal:    Normal external appearance  Extremities:   Extremities normal, atraumatic, no cyanosis or edema  Pulses:   2+ and symmetric all extremities  Skin:   Skin color, texture, turgor normal, no rashes or lesions  Lymph nodes:   Cervical, supraclavicular, and axillary nodes normal  Neurologic:   CNII-XII intact, normal strength, sensation and reflexes    throughout          Assessment & Plan:

## 2016-12-26 ENCOUNTER — Encounter: Payer: Self-pay | Admitting: General Practice

## 2016-12-30 LAB — CYTOLOGY - PAP
DIAGNOSIS: NEGATIVE
HPV: NOT DETECTED

## 2016-12-31 ENCOUNTER — Encounter: Payer: Self-pay | Admitting: General Practice

## 2017-01-13 ENCOUNTER — Encounter: Payer: Self-pay | Admitting: Family Medicine

## 2017-01-13 ENCOUNTER — Ambulatory Visit (INDEPENDENT_AMBULATORY_CARE_PROVIDER_SITE_OTHER): Payer: No Typology Code available for payment source | Admitting: Family Medicine

## 2017-01-13 ENCOUNTER — Ambulatory Visit (HOSPITAL_BASED_OUTPATIENT_CLINIC_OR_DEPARTMENT_OTHER)
Admission: RE | Admit: 2017-01-13 | Discharge: 2017-01-13 | Disposition: A | Discharge status: Home / Self Care | Payer: No Typology Code available for payment source | Source: Ambulatory Visit | Attending: Family Medicine | Admitting: Family Medicine

## 2017-01-13 VITALS — BP 121/81 | HR 56 | Temp 98.1°F | Resp 17 | Ht 68.0 in | Wt 160.0 lb

## 2017-01-13 DIAGNOSIS — M25475 Effusion, left foot: Secondary | ICD-10-CM

## 2017-01-13 MED ORDER — MELOXICAM 15 MG PO TABS: 15.0000 mg | ORAL_TABLET | Freq: Every day | ORAL | 1 refills | Status: DC

## 2017-01-13 NOTE — Progress Notes (Signed)
Pre visit review using our clinic review tool, if applicable. No additional management support is needed unless otherwise documented below in the visit note. 

## 2017-01-13 NOTE — Progress Notes (Signed)
   Subjective:    Patient ID: Sandra [name], female    DOB: Apr 08, 1956, 61 y.o.   MRN: [id number]  HPI Edema- L foot.  Woke Friday morning w/ tightness when she plantar flexes the foot.  Last night developed swelling around lateral ankle.  No known injury.  No change in footwear.  No recent immobility or travel.  Painful to wear shoes.  Area on dorsum of foot is TTP.  No hx of gout.   Review of Systems For ROS see HPI     Objective:   Physical Exam  Constitutional: She is oriented to person, place, and time. She appears well-developed and well-nourished. No distress.  HENT:  Head: Normocephalic and atraumatic.  Cardiovascular: Intact distal pulses.   Pulmonary/Chest: Effort normal and breath sounds normal. No respiratory distress. She has no wheezes. She has no rales.  Musculoskeletal: She exhibits edema (L foot swelling from level of malleolus down to metatarsal heads.  no redness but TTP over dorsum of foot).  Neurological: She is alert and oriented to person, place, and time.  Skin: Skin is warm and dry. No erythema.  Vitals reviewed.         Assessment & Plan:  L foot swelling- new.  No known injury.  Pt has no hx of gout and doesn't have any redness or warmth.  Check uric acid level.  Pt is on chemo so she could be hypercoaguable.  Get [place] to assess for DVT.  Get xrays to assess for stress fx.  Start mobic for pain/inflammation.  Alternate heat/ice.  Reviewed supportive care and red flags that should prompt return.  Pt expressed understanding and is in agreement w/ plan.

## 2017-01-13 NOTE — Patient Instructions (Signed)
We'll notify you of your imaging (ultrasound and xray) and lab results Start the Meloxicam once daily- take w/ food.  This is in the same family as ibuprofen but different Alternate heat and ice Drink plenty of fluids Elevate your feet as much as possible Call with any questions or concerns Hang in there!!

## 2017-01-14 LAB — URIC ACID: Uric Acid, Serum: 3.5 mg/dL (ref 2.5–7.0)

## 2017-01-17 ENCOUNTER — Encounter: Payer: Self-pay | Admitting: Physician Assistant

## 2017-01-17 ENCOUNTER — Ambulatory Visit (INDEPENDENT_AMBULATORY_CARE_PROVIDER_SITE_OTHER): Payer: No Typology Code available for payment source | Admitting: Physician Assistant

## 2017-01-17 VITALS — BP 104/60 | HR 65 | Temp 97.9°F | Resp 14 | Ht 68.0 in | Wt 160.0 lb

## 2017-01-17 DIAGNOSIS — J0110 Acute frontal sinusitis, unspecified: Secondary | ICD-10-CM

## 2017-01-17 MED ORDER — AMOXICILLIN-POT CLAVULANATE 875-125 MG PO TABS: 1.0000 | ORAL_TABLET | Freq: Two times a day (BID) | ORAL | 0 refills | Status: DC

## 2017-01-17 NOTE — Patient Instructions (Signed)
Please take antibiotic as directed.  Increase fluid intake.  Use Saline nasal spray.  Take a daily multivitamin. Restart Flonase.  Place a humidifier in the bedroom.  Please call or return clinic if symptoms are not improving.  Sinusitis Sinusitis is redness, soreness, and swelling (inflammation) of the paranasal sinuses. Paranasal sinuses are air pockets within the bones of your face (beneath the eyes, the middle of the forehead, or above the eyes). In healthy paranasal sinuses, mucus is able to drain out, and air is able to circulate through them by way of your nose. However, when your paranasal sinuses are inflamed, mucus and air can become trapped. This can allow bacteria and other germs to grow and cause infection. Sinusitis can develop quickly and last only a short time (acute) or continue over a long period (chronic). Sinusitis that lasts for more than 12 weeks is considered chronic.  CAUSES  Causes of sinusitis include:  Allergies.  Structural abnormalities, such as displacement of the cartilage that separates your nostrils (deviated septum), which can decrease the air flow through your nose and sinuses and affect sinus drainage.  Functional abnormalities, such as when the small hairs (cilia) that line your sinuses and help remove mucus do not work properly or are not present. SYMPTOMS  Symptoms of acute and chronic sinusitis are the same. The primary symptoms are pain and pressure around the affected sinuses. Other symptoms include:  Upper toothache.  Earache.  Headache.  Bad breath.  Decreased sense of smell and taste.  A cough, which worsens when you are lying flat.  Fatigue.  Fever.  Thick drainage from your nose, which often is green and may contain pus (purulent).  Swelling and warmth over the affected sinuses. DIAGNOSIS  Your caregiver will perform a physical exam. During the exam, your caregiver may:  Look in your nose for signs of abnormal growths in your  nostrils (nasal polyps).  Tap over the affected sinus to check for signs of infection.  View the inside of your sinuses (endoscopy) with a special imaging device with a light attached (endoscope), which is inserted into your sinuses. If your caregiver suspects that you have chronic sinusitis, one or more of the following tests may be recommended:  Allergy tests.  Nasal culture A sample of mucus is taken from your nose and sent to a lab and screened for bacteria.  Nasal cytology A sample of mucus is taken from your nose and examined by your caregiver to determine if your sinusitis is related to an allergy. TREATMENT  Most cases of acute sinusitis are related to a viral infection and will resolve on their own within 10 days. Sometimes medicines are prescribed to help relieve symptoms (pain medicine, decongestants, nasal steroid sprays, or saline sprays).  However, for sinusitis related to a bacterial infection, your caregiver will prescribe antibiotic medicines. These are medicines that will help kill the bacteria causing the infection.  Rarely, sinusitis is caused by a fungal infection. In theses cases, your caregiver will prescribe antifungal medicine. For some cases of chronic sinusitis, surgery is needed. Generally, these are cases in which sinusitis recurs more than 3 times per year, despite other treatments. HOME CARE INSTRUCTIONS   Drink plenty of water. Water helps thin the mucus so your sinuses can drain more easily.  Use a humidifier.  Inhale steam 3 to 4 times a day (for example, sit in the bathroom with the shower running).  Apply a warm, moist washcloth to your face 3 to 4 times  a day, or as directed by your caregiver.  Use saline nasal sprays to help moisten and clean your sinuses.  Take over-the-counter or prescription medicines for pain, discomfort, or fever only as directed by your caregiver. SEEK IMMEDIATE MEDICAL CARE IF:  You have increasing pain or severe  headaches.  You have nausea, vomiting, or drowsiness.  You have swelling around your face.  You have vision problems.  You have a stiff neck.  You have difficulty breathing. MAKE SURE YOU:   Understand these instructions.  Will watch your condition.  Will get help right away if you are not doing well or get worse. Document Released: 09/16/2005 Document Revised: 12/09/2011 Document Reviewed: 10/01/2011 [facility] Patient Information 2014 [place], [place].

## 2017-01-17 NOTE — Progress Notes (Signed)
Pre visit review using our clinic review tool, if applicable. No additional management support is needed unless otherwise documented below in the visit note. 

## 2017-01-17 NOTE — Progress Notes (Signed)
Patient presents to clinic today c/o 1.5 weeks of sinus pressure, frontal sinus pain L > R, bilateral ear pressure and L ear pain. Endorses significant post nasal drip. Notes thick rhinorrhea. Denies fever, chills. Denies chest congestion or cough. Denies recent travel or sick contact.   Past Medical History:  Diagnosis Date  . Cancer (HCC)    breast  . Chicken pox   . GERD (gastroesophageal reflux disease)   . History of blood transfusion     Current Outpatient Prescriptions on File Prior to Visit  Medication Sig Dispense Refill  . Ascorbic Acid (VITAMIN C) 1000 MG tablet Take 1,000 mg by mouth daily.    . B Complex-C-E-Zn (BEC/ZINC PO) Take 1 tablet by mouth daily.    . calcium carbonate (OS-CAL) 600 MG TABS Take 600 mg by mouth daily.      . cholecalciferol (VITAMIN D) 400 UNITS TABS Take by mouth daily.      . fish oil-omega-3 fatty acids 1000 MG capsule Take 2 g by mouth daily.      . fluticasone (FLONASE) 50 MCG/ACT nasal spray Place 2 sprays into both nostrils daily. 16 g 2  . meloxicam (MOBIC) 15 MG tablet Take 1 tablet (15 mg total) by mouth daily. 30 tablet 1  . tamoxifen (NOLVADEX) 20 MG tablet Take 1 tablet (20 mg total) by mouth daily. 90 tablet 9  . venlafaxine XR (EFFEXOR-XR) 75 MG 24 hr capsule Take 1 capsule (75 mg total) by mouth daily with breakfast. 90 capsule 4   No current facility-administered medications on file prior to visit.     Allergies  Allergen Reactions  . Ibuprofen Nausea And Vomiting and Other (See Comments)    Becomes incoherent    Family History  Problem Relation Age of Onset  . Anesthesia problems Mother     Nausea  . Heart disease Father   . Heart disease Maternal Grandfather   . Diabetes Paternal Grandfather     Social History   Social History  . Marital status: Single    Spouse name: N/A  . Number of children: N/A  . Years of education: N/A   Social History Main Topics  . Smoking status: Never Smoker  . Smokeless tobacco:  Never Used  . Alcohol use Yes     Comment: mild  . Drug use: No  . Sexual activity: Yes    Birth control/ protection: Post-menopausal   Other Topics Concern  . None   Social History Narrative  . None   Review of Systems - See HPI.  All other ROS are negative.  BP 104/60   Pulse 65   Temp 97.9 F (36.6 C) (Oral)   Resp 14   Ht 5\' 8"  (1.727 m)   Wt 160 lb (72.6 kg)   LMP 11/28/2008   SpO2 99%   BMI 24.33 kg/m   Physical Exam  Constitutional: She is oriented to person, place, and time and well-developed, well-nourished, and in no distress.  HENT:  Head: Normocephalic and atraumatic.  Right Ear: Ear canal normal. A middle ear effusion is present.  Left Ear: Ear canal normal. A middle ear effusion is present.  Nose: Mucosal edema and rhinorrhea present. Right sinus exhibits frontal sinus tenderness. Left sinus exhibits frontal sinus tenderness.  Mouth/Throat: Uvula is midline, oropharynx is clear and moist and mucous membranes are normal.  Eyes: Conjunctivae are normal.  Neck: Neck supple.  Cardiovascular: Normal rate, regular rhythm, normal heart sounds and intact distal pulses.  Pulmonary/Chest: Effort normal and breath sounds normal. No respiratory distress. She has no wheezes. She has no rales. She exhibits no tenderness.  Neurological: She is alert and oriented to person, place, and time.  Skin: Skin is warm and dry. No rash noted.  Psychiatric: Affect normal.  Vitals reviewed.   Recent Results (from the past 2160 hour(s))  Cytology - PAP     Status: None   Collection Time: 12/25/16 12:00 AM  Result Value Ref Range   Adequacy      Satisfactory for evaluation  endocervical/transformation zone component PRESENT.   Diagnosis      NEGATIVE FOR INTRAEPITHELIAL LESIONS OR MALIGNANCY.   HPV NOT DETECTED     Comment: Normal Reference Range - NOT Detected   Material Submitted CervicoVaginal Pap [ThinPrep Imaged]    CYTOLOGY - PAP PAP RESULT   Lipid panel     Status:  Abnormal   Collection Time: 12/25/16  9:50 AM  Result Value Ref Range   Cholesterol 200 0 - 200 mg/dL    Comment: ATP III Classification       Desirable:  < 200 mg/dL               Borderline High:  200 - 239 mg/dL          High:  > = 240 mg/dL   Triglycerides 57.0 0.0 - 149.0 mg/dL    Comment: Normal:  <150 mg/dLBorderline High:  150 - 199 mg/dL   HDL 80.80 >39.00 mg/dL   VLDL 11.4 0.0 - 40.0 mg/dL   LDL Cholesterol 108 (H) 0 - 99 mg/dL   Total CHOL/HDL Ratio 2     Comment:                Men          Women1/2 Average Risk     3.4          3.3Average Risk          5.0          4.42X Average Risk          9.6          7.13X Average Risk          15.0          11.0                       NonHDL 119.18     Comment: NOTE:  Non-HDL goal should be 30 mg/dL higher than patient's LDL goal (i.e. LDL goal of < 70 mg/dL, would have non-HDL goal of < 100 mg/dL)  Basic metabolic panel     Status: Abnormal   Collection Time: 12/25/16  9:50 AM  Result Value Ref Range   Sodium 141 135 - 145 mEq/L   Potassium 4.4 3.5 - 5.1 mEq/L   Chloride 101 96 - 112 mEq/L   CO2 34 (H) 19 - 32 mEq/L   Glucose, Bld 103 (H) 70 - 99 mg/dL   BUN 18 6 - 23 mg/dL   Creatinine, Ser 0.61 0.40 - 1.20 mg/dL   Calcium 9.5 8.4 - 10.5 mg/dL   GFR 106.60 >60.00 mL/min  TSH     Status: None   Collection Time: 12/25/16  9:50 AM  Result Value Ref Range   TSH 1.38 0.35 - 4.50 uIU/mL  Hepatic function panel     Status: None   Collection Time: 12/25/16  9:50 AM  Result Value Ref  Range   Total Bilirubin 0.4 0.2 - 1.2 mg/dL   Bilirubin, Direct 0.1 0.0 - 0.3 mg/dL   Alkaline Phosphatase 47 39 - 117 U/L   AST 23 0 - 37 U/L   ALT 22 0 - 35 U/L   Total Protein 6.5 6.0 - 8.3 g/dL   Albumin 4.6 3.5 - 5.2 g/dL  CBC with Differential/Platelet     Status: None   Collection Time: 12/25/16  9:50 AM  Result Value Ref Range   WBC 5.6 4.0 - 10.5 K/uL   RBC 4.27 3.87 - 5.11 Mil/uL   Hemoglobin 13.5 12.0 - 15.0 g/dL   HCT 40.3 36.0 - 46.0  %   MCV 94.5 78.0 - 100.0 fl   MCHC 33.4 30.0 - 36.0 g/dL   RDW 13.5 11.5 - 15.5 %   Platelets 235.0 150.0 - 400.0 K/uL   Neutrophils Relative % 75.2 43.0 - 77.0 %   Lymphocytes Relative 15.4 12.0 - 46.0 %   Monocytes Relative 7.6 3.0 - 12.0 %   Eosinophils Relative 1.1 0.0 - 5.0 %   Basophils Relative 0.7 0.0 - 3.0 %   Neutro Abs 4.2 1.4 - 7.7 K/uL   Lymphs Abs 0.9 0.7 - 4.0 K/uL   Monocytes Absolute 0.4 0.1 - 1.0 K/uL   Eosinophils Absolute 0.1 0.0 - 0.7 K/uL   Basophils Absolute 0.0 0.0 - 0.1 K/uL  Uric acid     Status: None   Collection Time: 01/13/17  3:58 PM  Result Value Ref Range   Uric Acid, Serum 3.5 2.5 - 7.0 mg/dL    Assessment/Plan: 1. Acute non-recurrent frontal sinusitis Rx Augmentin.  Increase fluids.  Rest.  Saline nasal spray.  Probiotic.  Mucinex as directed.  Humidifier in bedroom. Restart Flonase.  Call or return to clinic if symptoms are not improving.  - amoxicillin-clavulanate (AUGMENTIN) 875-125 MG tablet; Take 1 tablet by mouth 2 (two) times daily.  Dispense: 14 tablet; Refill: 0   Leeanne Rio, [place]

## 2017-01-23 ENCOUNTER — Ambulatory Visit (HOSPITAL_BASED_OUTPATIENT_CLINIC_OR_DEPARTMENT_OTHER): Payer: No Typology Code available for payment source | Admitting: Oncology

## 2017-01-23 ENCOUNTER — Telehealth: Payer: Self-pay | Admitting: Oncology

## 2017-01-23 ENCOUNTER — Other Ambulatory Visit (HOSPITAL_BASED_OUTPATIENT_CLINIC_OR_DEPARTMENT_OTHER): Payer: No Typology Code available for payment source

## 2017-01-23 VITALS — BP 98/59 | HR 77 | Temp 98.0°F | Resp 18 | Ht 68.0 in | Wt 158.4 lb

## 2017-01-23 DIAGNOSIS — C50412 Malignant neoplasm of upper-outer quadrant of left female breast: Secondary | ICD-10-CM

## 2017-01-23 DIAGNOSIS — C50411 Malignant neoplasm of upper-outer quadrant of right female breast: Secondary | ICD-10-CM

## 2017-01-23 DIAGNOSIS — C50211 Malignant neoplasm of upper-inner quadrant of right female breast: Secondary | ICD-10-CM

## 2017-01-23 DIAGNOSIS — C50919 Malignant neoplasm of unspecified site of unspecified female breast: Secondary | ICD-10-CM

## 2017-01-23 DIAGNOSIS — M8580 Other specified disorders of bone density and structure, unspecified site: Secondary | ICD-10-CM

## 2017-01-23 DIAGNOSIS — Z170 Estrogen receptor positive status [ER+]: Secondary | ICD-10-CM | POA: Diagnosis not present

## 2017-01-23 LAB — COMPREHENSIVE METABOLIC PANEL
ALT: 28 U/L (ref 0–55)
AST: 25 U/L (ref 5–34)
Albumin: 4.2 g/dL (ref 3.5–5.0)
Alkaline Phosphatase: 52 U/L (ref 40–150)
Anion Gap: 8 mEq/L (ref 3–11)
BUN: 14.2 mg/dL (ref 7.0–26.0)
CO2: 31 meq/L — AB (ref 22–29)
CREATININE: 0.7 mg/dL (ref 0.6–1.1)
Calcium: 9.6 mg/dL (ref 8.4–10.4)
Chloride: 103 mEq/L (ref 98–109)
EGFR: 88 mL/min/{1.73_m2} — ABNORMAL LOW (ref >90)
GLUCOSE: 136 mg/dL (ref 70–140)
Potassium: 4 mEq/L (ref 3.5–5.1)
SODIUM: 143 meq/L (ref 136–145)
Total Bilirubin: 0.38 mg/dL (ref 0.20–1.20)
Total Protein: 6.7 g/dL (ref 6.4–8.3)

## 2017-01-23 LAB — CBC WITH DIFFERENTIAL/PLATELET
BASO%: 0.5 % (ref 0.0–2.0)
Basophils Absolute: 0 10*3/uL (ref 0.0–0.1)
EOS%: 1.1 % (ref 0.0–7.0)
Eosinophils Absolute: 0.1 10*3/uL (ref 0.0–0.5)
HEMATOCRIT: 41.2 % (ref 34.8–46.6)
HGB: 13.8 g/dL (ref 11.6–15.9)
LYMPH#: 0.9 10*3/uL (ref 0.9–3.3)
LYMPH%: 15.5 % (ref 14.0–49.7)
MCH: 31.5 pg (ref 25.1–34.0)
MCHC: 33.6 g/dL (ref 31.5–36.0)
MCV: 93.9 fL (ref 79.5–101.0)
MONO#: 0.5 10*3/uL (ref 0.1–0.9)
MONO%: 7.8 % (ref 0.0–14.0)
NEUT#: 4.6 10*3/uL (ref 1.5–6.5)
NEUT%: 75.1 % (ref 38.4–76.8)
Platelets: 207 10*3/uL (ref 145–400)
RBC: 4.38 10*6/uL (ref 3.70–5.45)
RDW: 13.1 % (ref 11.2–14.5)
WBC: 6.1 10*3/uL (ref 3.9–10.3)

## 2017-01-23 NOTE — Telephone Encounter (Signed)
Appointments scheduled per 01/23/17 los. Patient was given a copy of the AVS report and appointment schedule, per 01/23/17 los.

## 2017-01-23 NOTE — Progress Notes (Signed)
ID: Sandra [name]   DOB: 07/11/1956  MR#: [id number]  CSN#:[id number]  PCP: Katherine Tabori, MD GYN:  Uses PCP SU: Dr. Streck Other: Dr. Sanger with Plastic Surgery  CHIEF COMPLAINT: bilateral breast cancers  CURRENT TREATMENT: tamoxifen  BREAST CANCER HISTORY  From the original intake note:   Sandra [name] is a 61 year old [place] woman, who had a screening mammogram performed on 12/14/2009 which showed a spiculated mass 1 o'clock position.  Physical exam at that time showed a large firm palpable mass upper aspect left breast at 1 o'clock.  No palpable lymph nodes were seen.  Ultrasound showed a large ill-defined irregular hypoechoic mass 1 o'clock position measuring 3.0 x 2.5 x 1.8 cm.  Biopsy was recommended and was performed on 12/14/2009.  This was felt to be an invasive ductal cancer, ER/PR positive 99% and 95% respectively, proliferative index 8%, HER-2 was non amplified.  MRI scan performed on the left breast 12/20/2009 showed a mass measuring 2.1 x 2.1 x 2.5 cm in the left breast.  In the upper inner quadrant of the right breast there is an ill-defined lung mass enhancement measuring 2.5 x 2.3 cm.  No ultrasound correlate was identified for this lesion on the left and so an MRI-guided biopsy was scheduled.  The MRI guided biopsy of the right breast was performed on 12/27/09 resulting invasive ductal carcinoma with ductal carcinoma in situ.  She had a CT scan and PET on 01/02/10 which revealed a 4 mm lung nodule with no evidence of metastasis.  She began neoadjuvant chemotherapy beginning with dose dense FEC for 4 cycles from 01/11/10 to 02/22/10 followed by 1 cycle of dose dense Taxotere with poor tolerance and development of PPE.  She was then switched to Abraxane 3 weeks on with one week off for 2 cycles from 03/22/10 to 04/26/10.  On 06/27/10, she underwent bilateral simple mastectomies with bilateral sentinel lymph node dissection and expander placement with final pathology resulting  right-sided ypTis, ypN0, ER/PR positive, HER-2 negative breast cancer and left sided ypT2, ypN1 invasive ductal cancer.  She underwent radiation therapy under the care of Dr. Wentworth from 09/10/10 to 11/02/10.  She was started on Tamoxifen in February 2012 and has tolerated it well since.  Her subsequent history is as detailed below  INTERVAL HISTORY:   Sandra [name] returns today for follow-up of her bilateral breast cancers. She continues on tamoxifen. She tolerates that well. She does have some hot flashes although they have decreased. She is benefiting from venlafaxine. She has minimal vaginal wetness problems. She obtains a drug at a good price.  REVIEW OF SYSTEMS: Sandra [name] is doing "well" and feels she is in good health in general. She did have some sinus problems recently. She had left ankle swelling for several days, unilateral, and brought this to medical attention. This was evaluated with a left lower extremity Doppler ultrasonography 01/13/2017. This was negative. The left lower extremity swelling resolved as mysteriously as it occurred. Otherwise a detailed review of systems today was stable.  PAST MEDICAL HISTORY: Past Medical History:  Diagnosis Date  . Cancer (HCC)    breast  . Chicken pox   . GERD (gastroesophageal reflux disease)   . History of blood transfusion     PAST SURGICAL HISTORY: Past Surgical History:  Procedure Laterality Date  . AXILLARY LYMPH NODE DISSECTION  08/06/2010   left   . BREAST RECONSTRUCTION  08/15/2011   Procedure: BREAST RECONSTRUCTION;  Surgeon: Claire Sanger, DO;  Location: [facility];  Service: Plastics;  Laterality: Bilateral;  bilateral breast implant exchange  . BREAST RECONSTRUCTION  10/04/2011   Procedure: BREAST RECONSTRUCTION;  Surgeon: Claire Sanger, DO;  Location: [facility];  Service: Plastics;  Laterality: Left;  removal left breast implant with exchange for new implant  . MASTECTOMY  9/30 2011   bilat.  .  TONSILLECTOMY  1960s    FAMILY HISTORY Family History  Problem Relation Age of Onset  . Anesthesia problems Mother     Nausea  . Heart disease Father   . Heart disease Maternal Grandfather   . Diabetes Paternal Grandfather     GYNECOLOGIC HISTORY:  G0, P0, menarche age 14.  She has a history of fibroids.  No history of hormone replacement therapy.  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       ADVANCED DIRECTIVES:  Living will and Healthcare POA In place  HEALTH MAINTENANCE: Social History  Substance Use Topics  . Smoking status: Never Smoker  . Smokeless tobacco: Never Used  . Alcohol use Yes     Comment: mild     Colonoscopy:    PAP:  08/07/2012  Bone density:  09/02/12 resulting low bone mass  Lipid panel:  08/07/12  Allergies  Allergen Reactions  . Ibuprofen Nausea And Vomiting and Other (See Comments)    Becomes incoherent    Current Outpatient Prescriptions  Medication Sig Dispense Refill  . amoxicillin-clavulanate (AUGMENTIN) 875-125 MG tablet Take 1 tablet by mouth 2 (two) times daily. 14 tablet 0  . Ascorbic Acid (VITAMIN C) 1000 MG tablet Take 1,000 mg by mouth daily.    . B Complex-C-E-Zn (BEC/ZINC PO) Take 1 tablet by mouth daily.    . calcium carbonate (OS-CAL) 600 MG TABS Take 600 mg by mouth daily.      . cetirizine (ZYRTEC) 10 MG tablet Take 10 mg by mouth daily.    . cholecalciferol (VITAMIN D) 400 UNITS TABS Take by mouth daily.      . fish oil-omega-3 fatty acids 1000 MG capsule Take 2 g by mouth daily.      . fluticasone (FLONASE) 50 MCG/ACT nasal spray Place 2 sprays into both nostrils daily. 16 g 2  . meloxicam (MOBIC) 15 MG tablet Take 1 tablet (15 mg total) by mouth daily. 30 tablet 1  . tamoxifen (NOLVADEX) 20 MG tablet Take 1 tablet (20 mg total) by mouth daily. 90 tablet 9  . venlafaxine XR (EFFEXOR-XR) 75 MG 24 hr capsule Take 1 capsule (75 mg total) by mouth daily with breakfast. 90 capsule 4   No  current facility-administered medications for this visit.     OBJECTIVE: Middle-aged white woman Who appears well  Vitals:   01/23/17 1536  BP: (!) 98/59  Pulse: 77  Resp: 18  Temp: 98 F (36.7 C)     Body mass index is 24.08 kg/m.      Sclerae unicteric, EOMs intact Oropharynx clear and moist No cervical or supraclavicular adenopathy Lungs no rales or rhonchi Heart regular rate and rhythm Abd soft, nontender, positive bowel sounds MSK no focal spinal tenderness, no lower extremity lymphedema Neuro: nonfocal, well oriented, appropriate affect Breasts: Status post bilateral mastectomies with reconstruction, with no evidence of local recurrence. Both axillae are benign.   LAB RESULTS: Lab Results  Component Value Date   WBC 6.1 01/23/2017   NEUTROABS 4.6 01/23/2017   HGB 13.8 01/23/2017   HCT 41.2 01/23/2017     MCV 93.9 01/23/2017   PLT 207 01/23/2017      Chemistry      Component Value Date/Time   NA 141 12/25/2016 0950   NA 141 01/25/2016 1428   K 4.4 12/25/2016 0950   K 3.8 01/25/2016 1428   CL 101 12/25/2016 0950   CL 105 01/11/2013 0937   CO2 34 (H) 12/25/2016 0950   CO2 29 01/25/2016 1428   BUN 18 12/25/2016 0950   BUN 17.8 01/25/2016 1428   CREATININE 0.61 12/25/2016 0950   CREATININE 0.7 01/25/2016 1428      Component Value Date/Time   CALCIUM 9.5 12/25/2016 0950   CALCIUM 9.2 01/25/2016 1428   ALKPHOS 47 12/25/2016 0950   ALKPHOS 45 01/25/2016 1428   AST 23 12/25/2016 0950   AST 23 01/25/2016 1428   ALT 22 12/25/2016 0950   ALT 22 01/25/2016 1428   BILITOT 0.4 12/25/2016 0950   BILITOT 0.39 01/25/2016 1428       Lab Results  Component Value Date   LABCA2 22 06/12/2012    No components found for: LABCA125  No results for input(s): INR in the last 168 hours.  Urinalysis No results found for: COLORURINE  STUDIES: Us Venous Img Lower Unilateral Left  Result Date: 01/13/2017 CLINICAL DATA:  Left lower extremity swelling for 4 days.  EXAM: Left LOWER EXTREMITY VENOUS DOPPLER ULTRASOUND TECHNIQUE: Gray-scale sonography with graded compression, as well as color Doppler and duplex ultrasound were performed to evaluate the lower extremity deep venous systems from the level of the common femoral vein and including the common femoral, femoral, profunda femoral, popliteal and calf veins including the posterior tibial, peroneal and gastrocnemius veins when visible. The superficial great saphenous vein was also interrogated. Spectral Doppler was utilized to evaluate flow at rest and with distal augmentation maneuvers in the common femoral, femoral and popliteal veins. COMPARISON:  None. FINDINGS: Contralateral Common Femoral Vein: Respiratory phasicity is normal and symmetric with the symptomatic side. No evidence of thrombus. Normal compressibility. Common Femoral Vein: No evidence of thrombus. Normal compressibility, respiratory phasicity and response to augmentation. Saphenofemoral Junction: No evidence of thrombus. Normal compressibility and flow on color Doppler imaging. Profunda Femoral Vein: No evidence of thrombus. Normal compressibility and flow on color Doppler imaging. Femoral Vein: No evidence of thrombus. Normal compressibility, respiratory phasicity and response to augmentation. Popliteal Vein: No evidence of thrombus. Normal compressibility, respiratory phasicity and response to augmentation. Calf Veins: No evidence of thrombus. Normal compressibility and flow on color Doppler imaging. Superficial Great Saphenous Vein: No evidence of thrombus. Normal compressibility and flow on color Doppler imaging. Venous Reflux:  None. Other Findings:  None. IMPRESSION: No evidence of deep venous thrombosis. Electronically Signed   By: P.  Gallerani M.D.   On: 01/13/2017 18:05    ASSESSMENT: 60 y.o. [place] woman: #1  S/P biopsy of the left breast in 11/2009 for a clinical T2 N0 IDC, ER 99%, PR 95%, HER2-, Ki67 8% and biopsy of the right breast  on 12/27/09 for IDC with DCIS, ER 98%, PR 100%, Ki67 8%, HER2-.  #2  She began neoadjuvant chemotherapy beginning with dose dense FEC for 4 cycles from 01/11/10 to 02/22/10 followed by 1 cycle of dose dense Taxotere with poor tolerance and development of PPE.  She was then switched to Abraxane 3 weeks on with one week off for 2 cycles from 03/22/10 to 04/26/10.    #3  On 06/27/10, she underwent bilateral simple mastectomies with bilateral sentinel lymph node dissection   and expander placement with final pathology resulting right-sided ypTis, ypN0, ER/PR positive, HER-2 negative breast cancer and left sided ypT2, ypN1 invasive ductal cancer.    #4  She underwent radiation therapy under the care of Dr. Wentworth from 09/10/10 to 11/02/10.    #5  She was started on Tamoxifen in February 2012 and has tolerated it well since.  #6 osteopenia, bone density 01/16/2016 shows a T score of -1.9.  PLAN:  Sandra [name] is now 6-1/2 years out from definitive surgery for her breast cancer with no evidence of disease recurrence. This is very favorable.  She is tolerating tamoxifen well and the plan continues to be for 10 years on this drug.  We discussed her recent left ankle swelling. She might have had some minor trauma to that area that she did not recognize. Possibly she had a transient clot, but that seems less likely. Certainly if this recurs she will let her primary M.D. know immediately  Otherwise she will return to see me in one year. She knows to call for any other problems that may develop before then.   , C    01/23/2017  

## 2017-01-24 ENCOUNTER — Ambulatory Visit (INDEPENDENT_AMBULATORY_CARE_PROVIDER_SITE_OTHER): Payer: No Typology Code available for payment source | Admitting: Physician Assistant

## 2017-01-24 ENCOUNTER — Encounter: Payer: Self-pay | Admitting: Physician Assistant

## 2017-01-24 VITALS — BP 118/70 | HR 70 | Temp 97.6°F | Resp 14 | Ht 68.0 in | Wt 160.0 lb

## 2017-01-24 DIAGNOSIS — J0120 Acute ethmoidal sinusitis, unspecified: Secondary | ICD-10-CM | POA: Diagnosis not present

## 2017-01-24 MED ORDER — PREDNISONE 20 MG PO TABS: ORAL_TABLET | ORAL | 0 refills | Status: DC

## 2017-01-24 MED ORDER — DOXYCYCLINE HYCLATE 100 MG PO CAPS: 100.0000 mg | ORAL_CAPSULE | Freq: Two times a day (BID) | ORAL | 0 refills | Status: DC

## 2017-01-24 NOTE — Patient Instructions (Signed)
Please stop the Augmentin. Start Doxycycline as directed. Take prednisone as directed to help with sinus swelling and fluid behind ears. Continue other medications as directed. Increase fluid intake.  Use Saline nasal spray.  Take a daily multivitamin.  Place a humidifier in the bedroom.  Please call or return clinic if symptoms are not improving.  If eye pressure worsens despite treatment, please go to the ER for assessment.   Sinusitis Sinusitis is redness, soreness, and swelling (inflammation) of the paranasal sinuses. Paranasal sinuses are air pockets within the bones of your face (beneath the eyes, the middle of the forehead, or above the eyes). In healthy paranasal sinuses, mucus is able to drain out, and air is able to circulate through them by way of your nose. However, when your paranasal sinuses are inflamed, mucus and air can become trapped. This can allow bacteria and other germs to grow and cause infection. Sinusitis can develop quickly and last only a short time (acute) or continue over a long period (chronic). Sinusitis that lasts for more than 12 weeks is considered chronic.  CAUSES  Causes of sinusitis include:  Allergies.  Structural abnormalities, such as displacement of the cartilage that separates your nostrils (deviated septum), which can decrease the air flow through your nose and sinuses and affect sinus drainage.  Functional abnormalities, such as when the small hairs (cilia) that line your sinuses and help remove mucus do not work properly or are not present. SYMPTOMS  Symptoms of acute and chronic sinusitis are the same. The primary symptoms are pain and pressure around the affected sinuses. Other symptoms include:  Upper toothache.  Earache.  Headache.  Bad breath.  Decreased sense of smell and taste.  A cough, which worsens when you are lying flat.  Fatigue.  Fever.  Thick drainage from your nose, which often is green and may contain pus  (purulent).  Swelling and warmth over the affected sinuses. DIAGNOSIS  Your caregiver will perform a physical exam. During the exam, your caregiver may:  Look in your nose for signs of abnormal growths in your nostrils (nasal polyps).  Tap over the affected sinus to check for signs of infection.  View the inside of your sinuses (endoscopy) with a special imaging device with a light attached (endoscope), which is inserted into your sinuses. If your caregiver suspects that you have chronic sinusitis, one or more of the following tests may be recommended:  Allergy tests.  Nasal culture A sample of mucus is taken from your nose and sent to a lab and screened for bacteria.  Nasal cytology A sample of mucus is taken from your nose and examined by your caregiver to determine if your sinusitis is related to an allergy. TREATMENT  Most cases of acute sinusitis are related to a viral infection and will resolve on their own within 10 days. Sometimes medicines are prescribed to help relieve symptoms (pain medicine, decongestants, nasal steroid sprays, or saline sprays).  However, for sinusitis related to a bacterial infection, your caregiver will prescribe antibiotic medicines. These are medicines that will help kill the bacteria causing the infection.  Rarely, sinusitis is caused by a fungal infection. In theses cases, your caregiver will prescribe antifungal medicine. For some cases of chronic sinusitis, surgery is needed. Generally, these are cases in which sinusitis recurs more than 3 times per year, despite other treatments. HOME CARE INSTRUCTIONS   Drink plenty of water. Water helps thin the mucus so your sinuses can drain more easily.  Use a  humidifier.  Inhale steam 3 to 4 times a day (for example, sit in the bathroom with the shower running).  Apply a warm, moist washcloth to your face 3 to 4 times a day, or as directed by your caregiver.  Use saline nasal sprays to help moisten and  clean your sinuses.  Take over-the-counter or prescription medicines for pain, discomfort, or fever only as directed by your caregiver. SEEK IMMEDIATE MEDICAL CARE IF:  You have increasing pain or severe headaches.  You have nausea, vomiting, or drowsiness.  You have swelling around your face.  You have vision problems.  You have a stiff neck.  You have difficulty breathing. MAKE SURE YOU:   Understand these instructions.  Will watch your condition.  Will get help right away if you are not doing well or get worse. Document Released: 09/16/2005 Document Revised: 12/09/2011 Document Reviewed: 10/01/2011 [facility] Patient Information 2014 [place], [place].

## 2017-01-24 NOTE — Progress Notes (Signed)
Patient presents to clinic today c/o continued nasal congestion with sinus pressure and sinus pain L>R despite treatment with Augmentin. Notes symptoms improved with antibiotic but are still present. Only one day left of medication. Denies fever, chills. Notes increased popping in ears. Denies new or worsening symptoms.   Past Medical History:  Diagnosis Date  . Cancer (HCC)    breast  . Chicken pox   . GERD (gastroesophageal reflux disease)   . History of blood transfusion     Current Outpatient Prescriptions on File Prior to Visit  Medication Sig Dispense Refill  . amoxicillin-clavulanate (AUGMENTIN) 875-125 MG tablet Take 1 tablet by mouth 2 (two) times daily. 14 tablet 0  . Ascorbic Acid (VITAMIN C) 1000 MG tablet Take 1,000 mg by mouth daily.    . B Complex-C-E-Zn (BEC/ZINC PO) Take 1 tablet by mouth daily.    . calcium carbonate (OS-CAL) 600 MG TABS Take 600 mg by mouth daily.      . cetirizine (ZYRTEC) 10 MG tablet Take 10 mg by mouth daily.    . cholecalciferol (VITAMIN D) 400 UNITS TABS Take by mouth daily.      . fish oil-omega-3 fatty acids 1000 MG capsule Take 2 g by mouth daily.      . fluticasone (FLONASE) 50 MCG/ACT nasal spray Place 2 sprays into both nostrils daily. 16 g 2  . meloxicam (MOBIC) 15 MG tablet Take 1 tablet (15 mg total) by mouth daily. 30 tablet 1  . tamoxifen (NOLVADEX) 20 MG tablet Take 1 tablet (20 mg total) by mouth daily. 90 tablet 9  . venlafaxine XR (EFFEXOR-XR) 75 MG 24 hr capsule Take 1 capsule (75 mg total) by mouth daily with breakfast. 90 capsule 4   No current facility-administered medications on file prior to visit.     Allergies  Allergen Reactions  . Ibuprofen Nausea And Vomiting and Other (See Comments)    Becomes incoherent    Family History  Problem Relation Age of Onset  . Anesthesia problems Mother     Nausea  . Heart disease Father   . Heart disease Maternal Grandfather   . Diabetes Paternal Grandfather     Social  History   Social History  . Marital status: Single    Spouse name: N/A  . Number of children: N/A  . Years of education: N/A   Social History Main Topics  . Smoking status: Never Smoker  . Smokeless tobacco: Never Used  . Alcohol use Yes     Comment: mild  . Drug use: No  . Sexual activity: Yes    Birth control/ protection: Post-menopausal   Other Topics Concern  . None   Social History Narrative  . None   Review of Systems - See HPI.  All other ROS are negative.  BP 118/70   Pulse 70   Temp 97.6 F (36.4 C) (Oral)   Resp 14   Ht _0  (1.727 m)   Wt 160 lb (72.6 kg)   LMP 11/28/2008   SpO2 98%   BMI 24.33 kg/m   Physical Exam  Constitutional: She is oriented to person, place, and time and well-developed, well-nourished, and in no distress.  HENT:  Head: Normocephalic and atraumatic.  Right Ear: A middle ear effusion is present.  Left Ear: A middle ear effusion is present.  Nose: Mucosal edema and rhinorrhea present. Right sinus exhibits no maxillary sinus tenderness and no frontal sinus tenderness. Left sinus exhibits maxillary sinus tenderness and frontal  sinus tenderness.  Mouth/Throat: Uvula is midline, oropharynx is clear and moist and mucous membranes are normal.  Cardiovascular: Normal rate, regular rhythm, normal heart sounds and intact distal pulses.   Pulmonary/Chest: Effort normal and breath sounds normal. No respiratory distress. She has no wheezes. She has no rales. She exhibits no tenderness.  Neurological: She is alert and oriented to person, place, and time.  Skin: Skin is warm and dry. No rash noted.  Psychiatric: Affect normal.  Vitals reviewed.  Recent Results (from the past 2160 hour(s))  Cytology - PAP     Status: None   Collection Time: 12/25/16 12:00 AM  Result Value Ref Range   Adequacy      Satisfactory for evaluation  endocervical/transformation zone component PRESENT.   Diagnosis      NEGATIVE FOR INTRAEPITHELIAL LESIONS OR  MALIGNANCY.   HPV NOT DETECTED     Comment: Normal Reference Range - NOT Detected   Material Submitted CervicoVaginal Pap [ThinPrep Imaged]    CYTOLOGY - PAP PAP RESULT   Lipid panel     Status: Abnormal   Collection Time: 12/25/16  9:50 AM  Result Value Ref Range   Cholesterol 200 0 - 200 mg/dL    Comment: ATP III Classification       Desirable:  < 200 mg/dL               Borderline High:  200 - 239 mg/dL          High:  > = 240 mg/dL   Triglycerides 57.0 0.0 - 149.0 mg/dL    Comment: Normal:  <150 mg/dLBorderline High:  150 - 199 mg/dL   HDL 80.80 >39.00 mg/dL   VLDL 11.4 0.0 - 40.0 mg/dL   LDL Cholesterol 108 (H) 0 - 99 mg/dL   Total CHOL/HDL Ratio 2     Comment:                Men          Women1/2 Average Risk     3.4          3.3Average Risk          5.0          4.42X Average Risk          9.6          7.13X Average Risk          15.0          11.0                       NonHDL 119.18     Comment: NOTE:  Non-HDL goal should be 30 mg/dL higher than patient's LDL goal (i.e. LDL goal of < 70 mg/dL, would have non-HDL goal of < 100 mg/dL)  Basic metabolic panel     Status: Abnormal   Collection Time: 12/25/16  9:50 AM  Result Value Ref Range   Sodium 141 135 - 145 mEq/L   Potassium 4.4 3.5 - 5.1 mEq/L   Chloride 101 96 - 112 mEq/L   CO2 34 (H) 19 - 32 mEq/L   Glucose, Bld 103 (H) 70 - 99 mg/dL   BUN 18 6 - 23 mg/dL   Creatinine, Ser 0.61 0.40 - 1.20 mg/dL   Calcium 9.5 8.4 - 10.5 mg/dL   GFR 106.60 >60.00 mL/min  TSH     Status: None   Collection Time: 12/25/16  9:50 AM  Result Value Ref  Range   TSH 1.38 0.35 - 4.50 uIU/mL  Hepatic function panel     Status: None   Collection Time: 12/25/16  9:50 AM  Result Value Ref Range   Total Bilirubin 0.4 0.2 - 1.2 mg/dL   Bilirubin, Direct 0.1 0.0 - 0.3 mg/dL   Alkaline Phosphatase 47 39 - 117 U/L   AST 23 0 - 37 U/L   ALT 22 0 - 35 U/L   Total Protein 6.5 6.0 - 8.3 g/dL   Albumin 4.6 3.5 - 5.2 g/dL  CBC with  Differential/Platelet     Status: None   Collection Time: 12/25/16  9:50 AM  Result Value Ref Range   WBC 5.6 4.0 - 10.5 K/uL   RBC 4.27 3.87 - 5.11 Mil/uL   Hemoglobin 13.5 12.0 - 15.0 g/dL   HCT 40.3 36.0 - 46.0 %   MCV 94.5 78.0 - 100.0 fl   MCHC 33.4 30.0 - 36.0 g/dL   RDW 13.5 11.5 - 15.5 %   Platelets 235.0 150.0 - 400.0 K/uL   Neutrophils Relative % 75.2 43.0 - 77.0 %   Lymphocytes Relative 15.4 12.0 - 46.0 %   Monocytes Relative 7.6 3.0 - 12.0 %   Eosinophils Relative 1.1 0.0 - 5.0 %   Basophils Relative 0.7 0.0 - 3.0 %   Neutro Abs 4.2 1.4 - 7.7 K/uL   Lymphs Abs 0.9 0.7 - 4.0 K/uL   Monocytes Absolute 0.4 0.1 - 1.0 K/uL   Eosinophils Absolute 0.1 0.0 - 0.7 K/uL   Basophils Absolute 0.0 0.0 - 0.1 K/uL  Uric acid     Status: None   Collection Time: 01/13/17  3:58 PM  Result Value Ref Range   Uric Acid, Serum 3.5 2.5 - 7.0 mg/dL  CBC with Differential/Platelet     Status: None   Collection Time: 01/23/17  2:34 PM  Result Value Ref Range   WBC 6.1 3.9 - 10.3 10e3/uL   NEUT# 4.6 1.5 - 6.5 10e3/uL   HGB 13.8 11.6 - 15.9 g/dL   HCT 41.2 34.8 - 46.6 %   Platelets 207 145 - 400 10e3/uL   MCV 93.9 79.5 - 101.0 fL   MCH 31.5 25.1 - 34.0 pg   MCHC 33.6 31.5 - 36.0 g/dL   RBC 4.38 3.70 - 5.45 10e6/uL   RDW 13.1 11.2 - 14.5 %   lymph# 0.9 0.9 - 3.3 10e3/uL   MONO# 0.5 0.1 - 0.9 10e3/uL   Eosinophils Absolute 0.1 0.0 - 0.5 10e3/uL   Basophils Absolute 0.0 0.0 - 0.1 10e3/uL   NEUT% 75.1 38.4 - 76.8 %   LYMPH% 15.5 14.0 - 49.7 %   MONO% 7.8 0.0 - 14.0 %   EOS% 1.1 0.0 - 7.0 %   BASO% 0.5 0.0 - 2.0 %  Comprehensive metabolic panel     Status: Abnormal   Collection Time: 01/23/17  2:34 PM  Result Value Ref Range   Sodium 143 136 - 145 mEq/L   Potassium 4.0 3.5 - 5.1 mEq/L   Chloride 103 98 - 109 mEq/L   CO2 31 (H) 22 - 29 mEq/L   Glucose 136 70 - 140 mg/dl    Comment: Glucose reference range is for nonfasting patients. Fasting glucose reference range is 70- 100.   BUN  14.2 7.0 - 26.0 mg/dL   Creatinine 0.7 0.6 - 1.1 mg/dL   Total Bilirubin 0.38 0.20 - 1.20 mg/dL   Alkaline Phosphatase 52 40 - 150 U/L   AST  25 5 - 34 U/L   ALT 28 0 - 55 U/L   Total Protein 6.7 6.4 - 8.3 g/dL   Albumin 4.2 3.5 - 5.0 g/dL   Calcium 9.6 8.4 - 10.4 mg/dL   Anion Gap 8 3 - 11 mEq/L   EGFR 88 (L) >90 ml/min/1.73 m2    Comment: eGFR is calculated using the CKD-EPI Creatinine Equation (2009)    Assessment/Plan: 1. Acute non-recurrent ethmoidal sinusitis With maxillary sinus tenderness. Stop Augmentin. Start doxy. Rx prednisone taper to help with inflammation and ETD. Continue Flonase. Alarm signs/symptoms discussed with patient that would prompt ER/UC assessment over weekend.  - predniSONE (DELTASONE) 20 MG tablet; Take 2 tablet by mouth x 3 days, then 1 tablet x 3 days.  Dispense: 9 tablet; Refill: 0 - doxycycline (VIBRAMYCIN) 100 MG capsule; Take 1 capsule (100 mg total) by mouth 2 (two) times daily.  Dispense: 14 capsule; Refill: 0    Leeanne Rio, [place]

## 2017-01-24 NOTE — Progress Notes (Signed)
Pre visit review using our clinic review tool, if applicable. No additional management support is needed unless otherwise documented below in the visit note. 

## 2017-01-31 ENCOUNTER — Other Ambulatory Visit: Payer: Self-pay

## 2017-01-31 MED ORDER — VENLAFAXINE HCL ER 75 MG PO CP24: 75.0000 mg | ORAL_CAPSULE | Freq: Every day | ORAL | 4 refills | Status: DC

## 2017-02-14 ENCOUNTER — Telehealth: Payer: Self-pay | Admitting: Medical Oncology

## 2017-02-14 ENCOUNTER — Other Ambulatory Visit: Payer: Self-pay | Admitting: *Deleted

## 2017-02-14 MED ORDER — TAMOXIFEN CITRATE 20 MG PO TABS: 20.0000 mg | ORAL_TABLET | Freq: Every day | ORAL | 9 refills | Status: DC

## 2017-02-14 NOTE — Telephone Encounter (Signed)
Refills requests for tamoxifen-refill sent

## 2017-03-05 ENCOUNTER — Ambulatory Visit (HOSPITAL_BASED_OUTPATIENT_CLINIC_OR_DEPARTMENT_OTHER)
Admission: RE | Admit: 2017-03-05 | Discharge: 2017-03-05 | Disposition: A | Discharge status: Home / Self Care | Payer: No Typology Code available for payment source | Source: Ambulatory Visit | Attending: Family Medicine | Admitting: Family Medicine

## 2017-03-05 DIAGNOSIS — M25475 Effusion, left foot: Secondary | ICD-10-CM | POA: Diagnosis present

## 2017-03-18 ENCOUNTER — Telehealth: Payer: Self-pay | Admitting: Family Medicine

## 2017-03-18 DIAGNOSIS — R600 Localized edema: Secondary | ICD-10-CM

## 2017-03-18 NOTE — Telephone Encounter (Signed)
Okay 

## 2017-03-18 NOTE — Telephone Encounter (Signed)
Pt called stating that she was told by [facility] to call us regarding an appt at their location, when looking into the phone note below and explaining a transfer of care has been placed, pt stated that she did not want to transfer care from Dr. Birdie Riddle to Dr. Juleen China, that she was wanting to make an appt for a 2nd option on an ongoing issue that she is still having. When I explained that she really needs to come in and address this with Dr. Birdie Riddle, pt stated that she did not want to pay another co-pay regarding this matter. I let her know that if she made an appt at [facility] that a co-pay would be applied to est care and/or a co-pay for a sick visit and still may need to be referred to a specialist. Pt has asked for a call back from a nurse regarding this

## 2017-03-18 NOTE — Telephone Encounter (Signed)
Patient would like to switch PCP from Dr. Birdie Riddle of [place] to Dr. Juleen China of [place].  Please respond at your earliest convenience to acknowledge the patient's request.  Thank you,  -LL

## 2017-03-19 NOTE — Telephone Encounter (Signed)
I placed this referral, could we see if we could get her in with Dr. Paulla Fore this week?

## 2017-03-19 NOTE — Telephone Encounter (Signed)
Please call pt to clarify- if she has an appt anywhere in the [facility] system, it would be a copay for the visit.

## 2017-03-19 NOTE — Telephone Encounter (Signed)
Spoke with pt, she said this was a huge misunderstanding. Pt would like a referral to a specialist to look at her foot again. Pt is ok with Dr. Paulla Fore at [facility]. Referral placed for cellulitis and left foot pain.

## 2017-03-20 ENCOUNTER — Ambulatory Visit (INDEPENDENT_AMBULATORY_CARE_PROVIDER_SITE_OTHER): Payer: No Typology Code available for payment source | Admitting: Sports Medicine

## 2017-03-20 ENCOUNTER — Encounter: Payer: Self-pay | Admitting: Sports Medicine

## 2017-03-20 VITALS — BP 104/62 | HR 67 | Ht 68.0 in | Wt 161.0 lb

## 2017-03-20 DIAGNOSIS — M25475 Effusion, left foot: Secondary | ICD-10-CM | POA: Diagnosis not present

## 2017-03-20 DIAGNOSIS — M7741 Metatarsalgia, right foot: Secondary | ICD-10-CM

## 2017-03-20 DIAGNOSIS — R600 Localized edema: Secondary | ICD-10-CM | POA: Diagnosis not present

## 2017-03-20 DIAGNOSIS — M216X9 Other acquired deformities of unspecified foot: Secondary | ICD-10-CM | POA: Diagnosis not present

## 2017-03-20 NOTE — Progress Notes (Signed)
OFFICE VISIT NOTE Sandra [name]. Sandra [name], Sandra [name] at [facility] [phone]  Sandra [name] - 61 y.o. female MRN [id number]  Date of birth: August 09, 1956  Visit Date: 03/20/2017  PCP: [name], MD   Referred by: [name], MD  Burlene Arnt, CMA acting as scribe for Dr. Paulla Fore.  SUBJECTIVE:   Chief Complaint  Patient presents with  . edema in the left foot    cellulitis and foot pain   HPI: As below and per problem based documentation when appropriate.  Pt presents today with complaint of left foot tightness and swelling.  Pt reports that she woke up one morning in April and noticed that her left foot was tight and swollen. She was seen and has labs, xray, and u/s and none of the results explained her swelling. Xray showed no bony or soft tissue abnormality. Labs came back showing no sign of gout. Doppler on 01/13/17 showed no evidence of DVT.   She reports that the swelling seems to be pretty consistent, no particular activity seems to cause the swelling to be worse. She has noticed that there is a little less swelling when she wakes up in the morning. She has noticed increased redness in the left foot and ankle which has gotten worse over the past 3-4 weeks. She says that internally the left foot/ankle feels warmer than the right but when she touches it, it feels the same as the right foot. She has not noticed any pitting.   She has tried Prednisone, Meloxicam for the swelling and had no relief. She has tried elevating the leg with minimal relief. She has not tried icing the ankle/foot since the first week the swelling started.   Pt denies fever, chills, night sweats.     Review of Systems  Constitutional: Negative for chills and fever.  Respiratory: Negative for shortness of breath and wheezing.   Cardiovascular: Positive for leg swelling. Negative for chest pain and palpitations.  Musculoskeletal: Negative for falls.    Neurological: Negative for dizziness, tingling and headaches.  Endo/Heme/Allergies: Does not bruise/bleed easily.    Otherwise per HPI.  HISTORY & PERTINENT PRIOR DATA:  No specialty comments available. She reports that she has never smoked. She has never used smokeless tobacco.   Recent Labs  01/13/17 1558  LABURIC 3.5   Medications & Allergies reviewed per EMR Patient Active Problem List   Diagnosis Date Noted  . Hot flashes 01/20/2015  . Patellofemoral stress syndrome of right knee 07/19/2014  . 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 Other malaise and fatigue 01/21/2014  . Sinusitis, acute maxillary 05/13/2013  . Facial dermatitis 09/07/2012  . Screening for malignant neoplasm of cervix 08/07/2012  . Encounter for routine gynecological examination 08/07/2012  . Uterine fibroid 08/07/2012  . Osteopenia 07/20/2012  . GERD (gastroesophageal reflux disease) 07/20/2012  . Plantar fasciitis 07/20/2012  . Bilateral breast cancer ([place]) 08/15/2011  . History of breast reconstruction 08/15/2011   Past Medical History:  Diagnosis Date  . Cancer (HCC)    breast  . Chicken pox   . GERD (gastroesophageal reflux disease)   . History of blood transfusion    Family History  Problem Relation Age of Onset  . Anesthesia problems Mother        Nausea  . Heart disease Father   . Heart disease Maternal Grandfather   . Diabetes Paternal Grandfather    Past Surgical History:  Procedure Laterality Date  .  AXILLARY LYMPH NODE DISSECTION  08/06/2010   left   . BREAST RECONSTRUCTION  08/15/2011   Procedure: BREAST RECONSTRUCTION;  Surgeon: Theodoro Kos, DO;  Location: [place];  Service: Plastics;  Laterality: Bilateral;  bilateral breast implant exchange  . BREAST RECONSTRUCTION  10/04/2011   Procedure: BREAST RECONSTRUCTION;  Surgeon: Theodoro Kos, DO;  Location: [place];  Service: Plastics;  Laterality: Left;  removal left breast implant with  exchange for Sandra implant  . MASTECTOMY  9/30 2011   bilat.  . TONSILLECTOMY  1960s   Social History   Occupational History  . Not on file.   Social History Main Topics  . Smoking status: Never Smoker  . Smokeless tobacco: Never Used  . Alcohol use Yes     Comment: mild  . Drug use: No  . Sexual activity: Yes    Birth control/ protection: Post-menopausal    OBJECTIVE:  VS:  HT:_0  (172.7 cm)   WT:161 lb (73 kg)  BMI:24.5    BP:104/62  HR:67bpm  TEMP: ( )  RESP:97 % EXAM: Findings:  WDWN, NAD, Non-toxic appearing Alert & appropriately interactive Not depressed or anxious appearing No increased work of breathing. Pupils are equal. EOM intact without nystagmus No clubbing or cyanosis of the extremities appreciated No significant rashes/lesions/ulcerations overlying the examined area. DP & PT pulses 2+/4.  Only minimal forefoot edema on the left compared to the right. Sensation intact to light touch in lower extremities.  Bilateral feet: General swelling across the dorsum of the left foot.  She has pain with metatarsal squeeze test as well as pain with palpation along the metatarsal heads.  Ankle dorsiflexion to 100.  Good midfoot motion and only minimal hallux limitus.  She does have slight splay toe.     Dg Foot 2 Views Left  Result Date: 03/06/2017 CLINICAL DATA:  Left foot pain and swelling with erythema which began suddenly several days ago. No known injury. EXAM: LEFT FOOT - 2 VIEW COMPARISON:  None in PACs FINDINGS: The bones of the foot are subjectively adequately mineralized. The joint spaces are well maintained. There is no lytic or blastic lesion. No acute or healing fracture is observed. The soft tissues exhibit no foreign bodies, gas collections, or significant swelling. IMPRESSION: No acute bony or soft tissue abnormality of the left foot is observed. Is there clinical possibility of an insect bite that might explain the patient's symptoms? Electronically  Signed   By: David  Martinique M.D.   On: 03/06/2017 08:13   ASSESSMENT & PLAN:   Problem List Items Addressed This Visit    Loss of transverse plantar arch    She is losing the transverse plantar arch.  She appears to have fairly severe metatarsalgia from loss of the arch today.  We discussed once again custom cushion orthotics versus over-the-counter support. Long met pad added to her shoes today.  Additionally cushioning with Body Helix Compression Sleeve as well as over-the-counter knee high compression socks discussed. This does appear to be a component of venous insufficiency       Other Visit Diagnoses    Swelling of foot joint, left    -  Primary   Edema of left foot       Metatarsalgia of right foot          Follow-up: Return if symptoms worsen or fail to improve.   CMA/ATC served as Education administrator during this visit. History, Physical, and Plan performed by medical provider. Documentation  and orders reviewed and attested to.      Teresa Coombs, [facility] Physician

## 2017-03-20 NOTE — Patient Instructions (Signed)
I recommend you obtained a compression sleeve to help with your joint problems. There are many options on the market however I recommend obtaining a ankle Body Helix compression sleeve.  You can find information (including how to appropriate measure yourself for sizing) can be found at www.Body http://www.lambert.com/.  Many of these products are health savings account (HSA) eligible.   You can use the compression sleeve at any time throughout the day but is most important to use while being active as well as for 2 hours post-activity.   It is appropriate to ice following activity with the compression sleeve in place.  Purchase over-the-counter knee high compression sleeves

## 2017-03-31 NOTE — Assessment & Plan Note (Addendum)
She is losing the transverse plantar arch.  She appears to have fairly severe metatarsalgia from loss of the arch today.  We discussed once again custom cushion orthotics versus over-the-counter support. Long met pad added to her shoes today.  Additionally cushioning with Body Helix Compression Sleeve as well as over-the-counter knee high compression socks discussed. This does appear to be a component of venous insufficiency

## 2017-05-08 ENCOUNTER — Telehealth: Payer: Self-pay | Admitting: Family Medicine

## 2017-05-08 NOTE — Telephone Encounter (Signed)
Patient notified of PCP recommendations and is agreement and expresses an understanding.  

## 2017-05-08 NOTE — Telephone Encounter (Signed)
Called pt back and LMOVM to return call 

## 2017-05-08 NOTE — Telephone Encounter (Signed)
Tried calling pt, line was disconnected before I could talk to her. Pt had her tetanus 12/2009 she is up to date until 2021 as they are good for 10 years.

## 2017-05-08 NOTE — Telephone Encounter (Signed)
Patient needs to know when she had her last tetanus vaccine and if she is due to have one now.

## 2017-12-26 ENCOUNTER — Encounter: Payer: No Typology Code available for payment source | Admitting: Family Medicine

## 2018-01-21 NOTE — Progress Notes (Signed)
ID: [name]   DOB: 01-09-1956  MR#: [id number]  BOF#:[id number]  PCP: [name], MD GYN:  Uses PCP SU: Dr. Margot Chimes Other: Dr. Migdalia Dk with Plastic Surgery  CHIEF COMPLAINT: bilateral breast cancers  CURRENT TREATMENT: tamoxifen  BREAST CANCER HISTORY  From the original intake note:   Sandra [name] is a 62 year old [place] woman, who had a screening mammogram performed on 12/14/2009 which showed a spiculated mass 1 o'clock position.  Physical exam at that time showed a large firm palpable mass upper aspect left breast at 1 o'clock.  No palpable lymph nodes were seen.  Ultrasound showed a large ill-defined irregular hypoechoic mass 1 o'clock position measuring 3.0 x 2.5 x 1.8 cm.  Biopsy was recommended and was performed on 12/14/2009.  This was felt to be an invasive ductal cancer, ER/PR positive 99% and 95% respectively, proliferative index 8%, HER-2 was non amplified.  MRI scan performed on the left breast 12/20/2009 showed a mass measuring 2.1 x 2.1 x 2.5 cm in the left breast.  In the upper inner quadrant of the right breast there is an ill-defined lung mass enhancement measuring 2.5 x 2.3 cm.  No ultrasound correlate was identified for this lesion on the left and so an MRI-guided biopsy was scheduled.  The MRI guided biopsy of the right breast was performed on 12/27/09 resulting invasive ductal carcinoma with ductal carcinoma in situ.  She had a CT scan and PET on 01/02/10 which revealed a 4 mm lung nodule with no evidence of metastasis.  She began neoadjuvant chemotherapy beginning with dose dense FEC for 4 cycles from 01/11/10 to 02/22/10 followed by 1 cycle of dose dense Taxotere with poor tolerance and development of PPE.  She was then switched to Abraxane 3 weeks on with one week off for 2 cycles from 03/22/10 to 04/26/10.  On 06/27/10, she underwent bilateral simple mastectomies with bilateral sentinel lymph node dissection and expander placement with final pathology resulting  right-sided ypTis, ypN0, ER/PR positive, HER-2 negative breast cancer and left sided ypT2, ypN1 invasive ductal cancer.  She underwent radiation therapy under the care of Dr. Pablo Ledger from 09/10/10 to 11/02/10.  She was started on Tamoxifen in February 2012 and has tolerated it well since.  Her subsequent history is as detailed below  INTERVAL HISTORY:   Sandra [name] returns today for follow-up of her bilateral breast cancers. She continues on tamoxifen, with good tolerance. She still has occasional hot flashes, but she takes venlafaxine to aid this. She denies issues with increased vaginal discharge.   REVIEW OF SYSTEMS: Sandra [name] reports that she is still working. For exercise, she does yard york and remodeling her basement. She sleeps well. She denies unusual headaches, visual changes, nausea, vomiting, or dizziness. There has been no unusual cough, phlegm production, or pleurisy. This been no change in bowel or bladder habits. She denies unexplained fatigue or unexplained weight loss, bleeding, rash, or fever. A detailed review of systems was otherwise stable.    PAST MEDICAL HISTORY: Past Medical History:  Diagnosis Date  . Cancer (HCC)    breast  . Chicken pox   . GERD (gastroesophageal reflux disease)   . History of blood transfusion     PAST SURGICAL HISTORY: Past Surgical History:  Procedure Laterality Date  . AXILLARY LYMPH NODE DISSECTION  08/06/2010   left   . BREAST RECONSTRUCTION  08/15/2011   Procedure: BREAST RECONSTRUCTION;  Surgeon: Theodoro Kos, DO;  Location: [facility];  Service: Plastics;  Laterality: Bilateral;  bilateral breast implant exchange  . BREAST RECONSTRUCTION  10/04/2011   Procedure: BREAST RECONSTRUCTION;  Surgeon: Theodoro Kos, DO;  Location: [facility];  Service: Plastics;  Laterality: Left;  removal left breast implant with exchange for new implant  . MASTECTOMY  9/30 2011   bilat.  . TONSILLECTOMY  1960s    FAMILY  HISTORY Family History  Problem Relation Age of Onset  . Anesthesia problems Mother        Nausea  . Heart disease Father   . Heart disease Maternal Grandfather   . Diabetes Paternal Grandfather     GYNECOLOGIC HISTORY:  G0, P0, menarche age 68.  She has a history of fibroids.  No history of hormone replacement therapy.  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       ADVANCED DIRECTIVES:  Living will and Healthcare POA In place  HEALTH MAINTENANCE: Social History   Tobacco Use  . Smoking status: Never Smoker  . Smokeless tobacco: Never Used  Substance Use Topics  . Alcohol use: Yes    Comment: mild  . Drug use: No     Colonoscopy:    PAP:  08/07/2012  Bone density:  09/02/12 resulting low bone mass  Lipid panel:  08/07/12  Allergies  Allergen Reactions  . Ibuprofen Nausea And Vomiting and Other (See Comments)    Becomes incoherent    Current Outpatient Medications  Medication Sig Dispense Refill  . Ascorbic Acid (VITAMIN C) 1000 MG tablet Take 1,000 mg by mouth daily.    . B Complex-C-E-Zn (BEC/ZINC PO) Take 1 tablet by mouth daily.    . calcium carbonate (OS-CAL) 600 MG TABS Take 600 mg by mouth daily.      . cetirizine (ZYRTEC) 10 MG tablet Take 10 mg by mouth daily.    . cholecalciferol (VITAMIN D) 400 UNITS TABS Take by mouth daily.      . fish oil-omega-3 fatty acids 1000 MG capsule Take 2 g by mouth daily.      . fluticasone (FLONASE) 50 MCG/ACT nasal spray Place 2 sprays into both nostrils daily. 16 g 2  . tamoxifen (NOLVADEX) 20 MG tablet Take 1 tablet (20 mg total) by mouth daily. 90 tablet 9  . venlafaxine XR (EFFEXOR-XR) 75 MG 24 hr capsule Take 1 capsule (75 mg total) by mouth daily with breakfast. 90 capsule 4   No current facility-administered medications for this visit.     OBJECTIVE: Middle-aged white woman in no acute distress  Vitals:   01/22/18 1518  BP: 109/65  Pulse: 78  Resp: 18  Temp: 97.8 F (36.6  C)  SpO2: 98%     Body mass index is 24.56 kg/m.      Sclerae unicteric, pupils round and equal Oropharynx clear and moist No cervical or supraclavicular adenopathy Lungs no rales or rhonchi Heart regular rate and rhythm Abd soft, nontender, positive bowel sounds MSK no focal spinal tenderness, no upper extremity lymphedema Neuro: nonfocal, well oriented, appropriate affect Breasts status post bilateral mastectomies status post bilateral reconstruction.  There is no evidence of disease recurrence.  Both axillae are benign.  LAB RESULTS: Lab Results  Component Value Date   WBC 7.8 01/22/2018   NEUTROABS 5.9 01/22/2018   HGB 12.9 01/22/2018   HCT 37.9 01/22/2018   MCV 93.3 01/22/2018   PLT 229 01/22/2018      Chemistry      Component Value Date/Time  NA 143 01/23/2017 1434   K 4.0 01/23/2017 1434   CL 101 12/25/2016 0950   CL 105 01/11/2013 0937   CO2 31 (H) 01/23/2017 1434   BUN 14.2 01/23/2017 1434   CREATININE 0.7 01/23/2017 1434      Component Value Date/Time   CALCIUM 9.6 01/23/2017 1434   ALKPHOS 52 01/23/2017 1434   AST 25 01/23/2017 1434   ALT 28 01/23/2017 1434   BILITOT 0.38 01/23/2017 1434       Lab Results  Component Value Date   LABCA2 22 06/12/2012    No components found for: [id number]  No results for input(s): INR in the last 168 hours.  Urinalysis No results found for: COLORURINE  STUDIES: We reviewed her left foot films and a Doppler studies on the left leg from 01/13/2017  ASSESSMENT: 62 y.o. [place] woman: #1  S/P biopsy of the left breast in 11/2009 for a clinical T2 N0 IDC, ER 99%, PR 95%, HER2-, Ki67 8% and biopsy of the right breast on 12/27/09 for IDC with DCIS, ER 98%, PR 100%, Ki67 8%, HER2-.  #2  She began neoadjuvant chemotherapy beginning with dose dense FEC for 4 cycles from 01/11/10 to 02/22/10 followed by 1 cycle of dose dense Taxotere with poor tolerance and development of PPE.  She was then switched to Abraxane 3  weeks on with one week off for 2 cycles from 03/22/10 to 04/26/10.    #3  On 06/27/10, she underwent bilateral simple mastectomies with bilateral sentinel lymph node dissection and expander placement with final pathology resulting right-sided ypTis, ypN0, ER/PR positive, HER-2 negative breast cancer and left sided ypT2, ypN1 invasive ductal cancer.    #4  She underwent radiation therapy under the care of Dr. Pablo Ledger from 09/10/10 to 11/02/10.    #5  She was started on Tamoxifen in February 2012 and has tolerated it well since.  #6 osteopenia, bone density 01/16/2016 shows a T score of -1.9.  PLAN:  [place] is now nearly 8 years out from definitive surgery for her breast cancer with no evidence of disease recurrence.  This is very favorable.  She is tolerating tamoxifen well and the plan will be to continue that for a total of 10 years, which means 2 more visits here.  She was concerned about weight gain and I reviewed her weights all the way back to 2012.  At that time she weighed 148 pounds.  A year later she weighed 161 pounds which is also her current weight.  The weight gain in that year is due to menopause and of course no changes once metabolism.  Her weight has been very stable in the last 6 years between 151 and 861.  If she wishes to lose a few pounds she needs to cut back on carbohydrates.  We discussed that at length.  We also discussed her left ankle swelling.  I think this is going to be due to venous insufficiency.  This is uncommonly unilateral but that seems to be the case here.  I recommended she wear compression stockings.  She knows to call for any other issues that may develop before the next visit.   Deari Sessler, Virgie Dad, MD  01/22/18 3:32 PM Medical Oncology and Hematology [facility] [address] [place], [place] [address] Tel. [phone]    Fax. [phone]  This document serves as a record of services personally performed by Lurline Del,  MD. It was created on his behalf by Sheron Nightingale,  a trained medical scribe. The creation of this record is based on the scribe's personal observations and the provider's statements to them.   I have reviewed the above documentation for accuracy and completeness, and I agree with the above.

## 2018-01-22 ENCOUNTER — Telehealth: Payer: Self-pay | Admitting: Oncology

## 2018-01-22 ENCOUNTER — Inpatient Hospital Stay: Payer: BLUE CROSS/BLUE SHIELD | Attending: Oncology | Admitting: Oncology

## 2018-01-22 ENCOUNTER — Inpatient Hospital Stay: Payer: BLUE CROSS/BLUE SHIELD

## 2018-01-22 DIAGNOSIS — Z9012 Acquired absence of left breast and nipple: Secondary | ICD-10-CM | POA: Diagnosis not present

## 2018-01-22 DIAGNOSIS — C50919 Malignant neoplasm of unspecified site of unspecified female breast: Secondary | ICD-10-CM

## 2018-01-22 DIAGNOSIS — M8580 Other specified disorders of bone density and structure, unspecified site: Secondary | ICD-10-CM | POA: Diagnosis not present

## 2018-01-22 DIAGNOSIS — R232 Flushing: Secondary | ICD-10-CM

## 2018-01-22 DIAGNOSIS — C50912 Malignant neoplasm of unspecified site of left female breast: Secondary | ICD-10-CM | POA: Diagnosis not present

## 2018-01-22 DIAGNOSIS — Z923 Personal history of irradiation: Secondary | ICD-10-CM | POA: Diagnosis not present

## 2018-01-22 DIAGNOSIS — Z79899 Other long term (current) drug therapy: Secondary | ICD-10-CM | POA: Insufficient documentation

## 2018-01-22 DIAGNOSIS — C50911 Malignant neoplasm of unspecified site of right female breast: Secondary | ICD-10-CM | POA: Diagnosis not present

## 2018-01-22 DIAGNOSIS — Z79810 Long term (current) use of selective estrogen receptor modulators (SERMs): Secondary | ICD-10-CM

## 2018-01-22 DIAGNOSIS — K219 Gastro-esophageal reflux disease without esophagitis: Secondary | ICD-10-CM | POA: Insufficient documentation

## 2018-01-22 DIAGNOSIS — Z170 Estrogen receptor positive status [ER+]: Secondary | ICD-10-CM

## 2018-01-22 DIAGNOSIS — M7989 Other specified soft tissue disorders: Secondary | ICD-10-CM | POA: Insufficient documentation

## 2018-01-22 LAB — CBC WITH DIFFERENTIAL/PLATELET
BASOS ABS: 0 10*3/uL (ref 0.0–0.1)
BASOS PCT: 0 %
EOS ABS: 0.1 10*3/uL (ref 0.0–0.5)
Eosinophils Relative: 1 %
HEMATOCRIT: 37.9 % (ref 34.8–46.6)
Hemoglobin: 12.9 g/dL (ref 11.6–15.9)
Lymphocytes Relative: 14 %
Lymphs Abs: 1.1 10*3/uL (ref 0.9–3.3)
MCH: 31.8 pg (ref 25.1–34.0)
MCHC: 34 g/dL (ref 31.5–36.0)
MCV: 93.3 fL (ref 79.5–101.0)
MONO ABS: 0.7 10*3/uL (ref 0.1–0.9)
Monocytes Relative: 8 %
NEUTROS ABS: 5.9 10*3/uL (ref 1.5–6.5)
NEUTROS PCT: 77 %
Platelets: 229 10*3/uL (ref 145–400)
RBC: 4.06 MIL/uL (ref 3.70–5.45)
RDW: 12.7 % (ref 11.2–14.5)
WBC: 7.8 10*3/uL (ref 3.9–10.3)

## 2018-01-22 LAB — COMPREHENSIVE METABOLIC PANEL
ALT: 20 U/L (ref 0–55)
ANION GAP: 6 (ref 3–11)
AST: 23 U/L (ref 5–34)
Albumin: 4 g/dL (ref 3.5–5.0)
Alkaline Phosphatase: 64 U/L (ref 40–150)
BILIRUBIN TOTAL: 0.2 mg/dL (ref 0.2–1.2)
BUN: 16 mg/dL (ref 7–26)
CALCIUM: 9.1 mg/dL (ref 8.4–10.4)
CO2: 28 mmol/L (ref 22–29)
CREATININE: 0.7 mg/dL (ref 0.60–1.10)
Chloride: 107 mmol/L (ref 98–109)
GFR calc non Af Amer: >60 mL/min (ref >60)
Glucose, Bld: 117 mg/dL (ref 70–140)
Potassium: 3.8 mmol/L (ref 3.5–5.1)
Sodium: 141 mmol/L (ref 136–145)
TOTAL PROTEIN: 6.7 g/dL (ref 6.4–8.3)

## 2018-01-22 NOTE — Telephone Encounter (Signed)
Gave avs and calendar ° °

## 2018-02-03 ENCOUNTER — Other Ambulatory Visit: Payer: Self-pay | Admitting: Oncology

## 2018-05-08 ENCOUNTER — Other Ambulatory Visit: Payer: Self-pay | Admitting: Oncology

## 2018-05-18 ENCOUNTER — Other Ambulatory Visit: Payer: Self-pay | Admitting: Oncology

## 2018-10-16 ENCOUNTER — Other Ambulatory Visit: Payer: Self-pay | Admitting: Oncology

## 2018-11-09 ENCOUNTER — Other Ambulatory Visit: Payer: Self-pay | Admitting: Oncology

## 2018-11-13 DIAGNOSIS — R55 Syncope and collapse: Secondary | ICD-10-CM | POA: Diagnosis not present

## 2018-11-13 DIAGNOSIS — M25552 Pain in left hip: Secondary | ICD-10-CM | POA: Diagnosis not present

## 2018-11-13 DIAGNOSIS — Z886 Allergy status to analgesic agent status: Secondary | ICD-10-CM | POA: Diagnosis not present

## 2018-11-13 DIAGNOSIS — S0181XA Laceration without foreign body of other part of head, initial encounter: Secondary | ICD-10-CM | POA: Diagnosis not present

## 2018-11-13 DIAGNOSIS — W07XXXA Fall from chair, initial encounter: Secondary | ICD-10-CM | POA: Diagnosis not present

## 2018-11-13 DIAGNOSIS — S0083XA Contusion of other part of head, initial encounter: Secondary | ICD-10-CM | POA: Diagnosis not present

## 2018-11-13 DIAGNOSIS — R569 Unspecified convulsions: Secondary | ICD-10-CM | POA: Diagnosis not present

## 2018-11-13 DIAGNOSIS — Z853 Personal history of malignant neoplasm of breast: Secondary | ICD-10-CM | POA: Diagnosis not present

## 2018-11-13 DIAGNOSIS — Y990 Civilian activity done for income or pay: Secondary | ICD-10-CM | POA: Diagnosis not present

## 2018-11-16 ENCOUNTER — Ambulatory Visit: Payer: Self-pay | Admitting: *Deleted

## 2018-11-16 NOTE — Telephone Encounter (Signed)
Pt called in wanting to know if what she could do anything else.   She was seen in the ED on Friday after the call.    I encouraged her to continue with the Tynelol   She was not experiencing any more symptoms.       She has an appt with Dr. Birdie Riddle on Wednesday.   She is going to request the ray results from [facility] to be sent to Dr. Birdie Riddle.  No further questions.      Reason for Disposition . [1] After 72 hours AND [2] headache persists  Answer Assessment - Initial Assessment Questions 1. MECHANISM: "How did the injury happen?" For falls, ask: "What height did you fall from?" and "What surface did you fall against?"      I was standing on a chair and fell when the chair fell over.   I hit my head.   I have contusions and stitches.   EMS took me to [facility] ED. 2. ONSET: "When did the injury happen?" (Minutes or hours ago)      Friday.   3. NEUROLOGIC SYMPTOMS: "Was there any loss of consciousness?" "Are there any other neurological symptoms?"      Headaches.   Dizziness only if get up too quickly but not now. 4. MENTAL STATUS: "Does the person know who he is, who you are, and where he is?"      Yes 5. LOCATION: "What part of the head was hit?"      I've been taking Tylenol.     Forehead, eye socket and stitches in my chin. 6. SCALP APPEARANCE: "What does the scalp look like? Is it bleeding now?" If so, ask: "Is it difficult to stop?"      See above.  Seen in ED 7. SIZE: For cuts, bruises, or swelling, ask: "How large is it?" (e.g., inches or centimeters)      Stitches in chin.   Had a full body scan done to r/o any broken bones or injuries in the ED. 8. PAIN: "Is there any pain?" If so, ask: "How bad is it?"  (e.g., Scale 1-10; or mild, moderate, severe)     Headaches and body aches from the fall  9. TETANUS: For any breaks in the skin, ask: "When was the last tetanus booster?"     Not asked seen in ED after fall on Friday 10. OTHER SYMPTOMS: "Do you have any other  symptoms?" (e.g., neck pain, vomiting)       Body aches 11. PREGNANCY: "Is there any chance you are pregnant?" "When was your last menstrual period?"       Not asked due to age  Protocols used: [place]

## 2018-11-18 ENCOUNTER — Encounter

## 2018-11-18 ENCOUNTER — Other Ambulatory Visit: Payer: Self-pay

## 2018-11-18 ENCOUNTER — Encounter: Payer: Self-pay | Admitting: Family Medicine

## 2018-11-18 ENCOUNTER — Ambulatory Visit: Payer: BLUE CROSS/BLUE SHIELD | Admitting: Family Medicine

## 2018-11-18 VITALS — BP 112/62 | HR 81 | Temp 98.1°F | Resp 16 | Ht 68.0 in | Wt 145.2 lb

## 2018-11-18 DIAGNOSIS — S0181XD Laceration without foreign body of other part of head, subsequent encounter: Secondary | ICD-10-CM | POA: Diagnosis not present

## 2018-11-18 DIAGNOSIS — R55 Syncope and collapse: Secondary | ICD-10-CM

## 2018-11-18 LAB — CBC WITH DIFFERENTIAL/PLATELET
BASOS ABS: 0 10*3/uL (ref 0.0–0.1)
Basophils Relative: 0.7 % (ref 0.0–3.0)
EOS ABS: 0.1 10*3/uL (ref 0.0–0.7)
Eosinophils Relative: 1.6 % (ref 0.0–5.0)
HEMATOCRIT: 41.3 % (ref 36.0–46.0)
Hemoglobin: 13.8 g/dL (ref 12.0–15.0)
LYMPHS ABS: 1 10*3/uL (ref 0.7–4.0)
Lymphocytes Relative: 17.1 % (ref 12.0–46.0)
MCHC: 33.4 g/dL (ref 30.0–36.0)
MCV: 93.5 fl (ref 78.0–100.0)
Monocytes Absolute: 0.4 10*3/uL (ref 0.1–1.0)
Monocytes Relative: 7.5 % (ref 3.0–12.0)
NEUTROS ABS: 4.1 10*3/uL (ref 1.4–7.7)
NEUTROS PCT: 73.1 % (ref 43.0–77.0)
PLATELETS: 229 10*3/uL (ref 150.0–400.0)
RBC: 4.42 Mil/uL (ref 3.87–5.11)
RDW: 13.3 % (ref 11.5–15.5)
WBC: 5.6 10*3/uL (ref 4.0–10.5)

## 2018-11-18 NOTE — Progress Notes (Signed)
   Subjective:    Patient ID: Sandra [name], female    DOB: June 12, 1956, 63 y.o.   MRN: [id number]  HPI ER f/u- pt fell on 2/14 and presented to [facility] ER w/ syncopal episode and chin laceration.  Pt reports standing on her office chair to reach a shelf.  She and chair fell over- she landed on R hip.  She got up 'to walk it off'.  Upon standing 'felt faint'.  Then she woke on floor in her office.  She has no memory of passing out and memory is blank until arrival at ER.  Pt had eaten lunch that day but nothing since.  Was feeling well prior to falling.  Pt is down nearly 20 lbs in a year, 'life has just been really hectic'.  Pt was working on increased water intake last week.   Review of Systems For ROS see HPI     Objective:   Physical Exam Vitals signs reviewed.  Constitutional:      General: She is not in acute distress.    Appearance: She is well-developed.  HENT:     Head: Normocephalic.     Comments: L chin laceration w/ stitches present Eyes:     Conjunctiva/sclera: Conjunctivae normal.     Pupils: Pupils are equal, round, and reactive to light.  Neck:     Musculoskeletal: Normal range of motion and neck supple.     Thyroid: No thyromegaly.  Cardiovascular:     Rate and Rhythm: Normal rate and regular rhythm.     Heart sounds: Normal heart sounds. No murmur.  Pulmonary:     Effort: Pulmonary effort is normal. No respiratory distress.     Breath sounds: Normal breath sounds.  Abdominal:     General: There is no distension.     Palpations: Abdomen is soft.     Tenderness: There is no abdominal tenderness.  Lymphadenopathy:     Cervical: No cervical adenopathy.  Skin:    General: Skin is warm and dry.  Neurological:     Mental Status: She is alert and oriented to person, place, and time.  Psychiatric:        Behavior: Behavior normal.           Assessment & Plan:  Syncope- new.  Reviewed pt's ER records, she had imaging but no lab work or EKG.  Suspect a  vagal episode after falling to the ground and then getting up to 'walk it off'.  Reviewed dx w/ pt.  Encouraged increased water intake, increased salt, change positions slowly.  Check labs to r/o metabolic cause.  EKG w/o abnormality that would cause syncope.  If pt has repeat episode, will need referral for additional w/u.  Pt expressed understanding and is in agreement w/ plan.   Chin laceration- well approximated, healing w/o complication.  Just a little early for suture removal.  Pt to return on Friday for removal.

## 2018-11-18 NOTE — Patient Instructions (Addendum)
Follow up on Friday to remove stitches We'll notify you of your lab results and make any changes if needed Make sure you are drinking LOTS of water! Continue to eat regularly Change positions slowly!  Allow yourself time to adjust! Tylenol as needed for pain.  Heating pad will also help! Call with any questions or concerns Hang in there!

## 2018-11-20 ENCOUNTER — Encounter: Payer: Self-pay | Admitting: Physician Assistant

## 2018-11-20 ENCOUNTER — Other Ambulatory Visit: Payer: Self-pay

## 2018-11-20 ENCOUNTER — Ambulatory Visit: Payer: BLUE CROSS/BLUE SHIELD | Admitting: Physician Assistant

## 2018-11-20 VITALS — BP 98/60 | HR 73 | Temp 97.6°F | Resp 14 | Ht 68.0 in | Wt 145.0 lb

## 2018-11-20 DIAGNOSIS — Z4802 Encounter for removal of sutures: Secondary | ICD-10-CM | POA: Diagnosis not present

## 2018-11-20 DIAGNOSIS — R55 Syncope and collapse: Secondary | ICD-10-CM

## 2018-11-20 NOTE — Patient Instructions (Signed)
Keep skin clean and dry.  Apply small amount of Vaseline or Neosporin over the area for a couple of days to protect the area. If you note any increased redness, warmth or tenderness, please call and let us know!

## 2018-11-20 NOTE — Progress Notes (Signed)
Patient presents to clinic today for future removal. 5 non-absorbable sutures in place. She had 2 deeper absorbable sutures placed according to ER note in [place]. Patient denies any pain, tenderness or drainage.  Past Medical History:  Diagnosis Date  . Cancer (HCC)    breast  . Chicken pox   . GERD (gastroesophageal reflux disease)   . History of blood transfusion     Current Outpatient Medications on File Prior to Visit  Medication Sig Dispense Refill  . Ascorbic Acid (VITAMIN C) 1000 MG tablet Take 1,000 mg by mouth daily.    . B Complex-C-E-Zn (BEC/ZINC PO) Take 1 tablet by mouth daily.    . calcium carbonate (OS-CAL) 600 MG TABS Take 600 mg by mouth daily.      . cetirizine (ZYRTEC) 10 MG tablet Take 10 mg by mouth daily.    . cholecalciferol (VITAMIN D) 400 UNITS TABS Take by mouth daily.      . fish oil-omega-3 fatty acids 1000 MG capsule Take 2 g by mouth daily.      . fluticasone (FLONASE) 50 MCG/ACT nasal spray Place 2 sprays into both nostrils daily. 16 g 2  . tamoxifen (NOLVADEX) 20 MG tablet TAKE 1 TABLET BY MOUTH EVERY DAY 90 tablet 9  . venlafaxine XR (EFFEXOR-XR) 75 MG 24 hr capsule TAKE 1 CAPSULE BY MOUTH DAILY WITH BREAKFAST 90 capsule 1   No current facility-administered medications on file prior to visit.     Allergies  Allergen Reactions  . Ibuprofen Nausea And Vomiting and Other (See Comments)    Becomes incoherent    Family History  Problem Relation Age of Onset  . Anesthesia problems Mother        Nausea  . Heart disease Father   . Heart disease Maternal Grandfather   . Diabetes Paternal Grandfather     Social History   Socioeconomic History  . Marital status: Single    Spouse name: Not on file  . Number of children: Not on file  . Years of education: Not on file  . Highest education level: Not on file  Occupational History  . Not on file  Social Needs  . Financial resource strain: Not on file  . Food insecurity:    Worry: Not  on file    Inability: Not on file  . Transportation needs:    Medical: Not on file    Non-medical: Not on file  Tobacco Use  . Smoking status: Never Smoker  . Smokeless tobacco: Never Used  Substance and Sexual Activity  . Alcohol use: Yes    Comment: mild  . Drug use: No  . Sexual activity: Yes    Birth control/protection: Post-menopausal  Lifestyle  . Physical activity:    Days per week: Not on file    Minutes per session: Not on file  . Stress: Not on file  Relationships  . Social connections:    Talks on phone: Not on file    Gets together: Not on file    Attends religious service: Not on file    Active member of club or organization: Not on file    Attends meetings of clubs or organizations: Not on file    Relationship status: Not on file  Other Topics Concern  . Not on file  Social History Narrative  . Not on file   Review of Systems - See HPI.  All other ROS are negative.  BP 98/60   Pulse 73   Temp  97.6 F (36.4 C) (Oral)   Resp 14   Ht 5\' 8"  (1.727 m)   Wt 145 lb (65.8 kg)   LMP 11/28/2008   SpO2 99%   BMI 22.05 kg/m   Physical Exam Vitals signs reviewed.  Constitutional:      Appearance: Normal appearance.  HENT:     Head: Normocephalic.  Neck:     Musculoskeletal: Neck supple.  Cardiovascular:     Rate and Rhythm: Normal rate and regular rhythm.  Pulmonary:     Effort: Pulmonary effort is normal.  Skin:      Neurological:     Mental Status: She is alert.     Recent Results (from the past 2160 hour(s))  CBC with Differential/Platelet     Status: None   Collection Time: 11/18/18  9:43 AM  Result Value Ref Range   WBC 5.6 4.0 - 10.5 K/uL   RBC 4.42 3.87 - 5.11 Mil/uL   Hemoglobin 13.8 12.0 - 15.0 g/dL   HCT 41.3 36.0 - 46.0 %   MCV 93.5 78.0 - 100.0 fl   MCHC 33.4 30.0 - 36.0 g/dL   RDW 13.3 11.5 - 15.5 %   Platelets 229.0 150.0 - 400.0 K/uL   Neutrophils Relative % 73.1 43.0 - 77.0 %   Lymphocytes Relative 17.1 12.0 - 46.0 %    Monocytes Relative 7.5 3.0 - 12.0 %   Eosinophils Relative 1.6 0.0 - 5.0 %   Basophils Relative 0.7 0.0 - 3.0 %   Neutro Abs 4.1 1.4 - 7.7 K/uL   Lymphs Abs 1.0 0.7 - 4.0 K/uL   Monocytes Absolute 0.4 0.1 - 1.0 K/uL   Eosinophils Absolute 0.1 0.0 - 0.7 K/uL   Basophils Absolute 0.0 0.0 - 0.1 K/uL    Assessment/Plan: 1. Visit for suture removal 5 simple interrupted sutures moved without issue. Thin amount of TAO applied over the area. Home measures and signs of infection reviewed with patient, including return precautions.   2. Syncope, unspecified syncope type Labs obtained the other day by PCP were hemolyzed and need redrawn. Will do today. - Basic metabolic panel - TSH   Leeanne Rio, PA-C

## 2018-11-21 LAB — BASIC METABOLIC PANEL
BUN: 15 mg/dL (ref 7–25)
CALCIUM: 9.2 mg/dL (ref 8.6–10.4)
CHLORIDE: 102 mmol/L (ref 98–110)
CO2: 29 mmol/L (ref 20–32)
Creat: 0.57 mg/dL (ref 0.50–0.99)
GLUCOSE: 96 mg/dL (ref 65–99)
Potassium: 4.2 mmol/L (ref 3.5–5.3)
SODIUM: 140 mmol/L (ref 135–146)

## 2018-11-21 LAB — TSH: TSH: 0.88 mIU/L (ref 0.40–4.50)

## 2018-11-21 LAB — EXTRA LAV TOP TUBE

## 2019-01-01 ENCOUNTER — Ambulatory Visit: Payer: Self-pay | Admitting: *Deleted

## 2019-01-01 NOTE — Telephone Encounter (Signed)
Pt called in c/o poison ivy exposure a week ago tomorrow.   She was requesting some OTC things to try to help with the itching.  I went over the home care advice with her along with some OTC medicans to try.  She thanked me for my help.    I went over with her when to call us back.   She verbalized understanding.   Reason for Disposition . Mild rash from poison ivy, oak or sumac  Answer Assessment - Initial Assessment Questions 1. APPEARANCE of RASH: "Describe the rash."      It very red and blistered.   I'm using Calamine lotion but it's still popping up new places every day.   It's been a week tomorrow. 2. LOCATION: "Where is the rash located?"      Mostly my arms hands and fingers.   Spot on thigh left and on my left waist. 3. SIZE: "How large is the rash?"      Blistered and red.   4. ONSET: "When did the rash begin?"      A week ago tomorrow.    5. ITCHING: "Does the rash itch?" If so, ask: "How bad is it?"   - MILD - doesn't interfere with normal activities   - MODERATE - SEVERE: interferes with work, school, sleep, or other activities      Moderate.   Using Benadryl which helps.    6. PREGNANCY: "Is there any chance you are pregnant?" "When was your last menstrual period?"     N/A due to age  Protocols used: [place]

## 2019-01-05 ENCOUNTER — Telehealth: Payer: Self-pay | Admitting: *Deleted

## 2019-01-05 NOTE — Telephone Encounter (Signed)
Pt left message on nurse VM stating she wants to keep scheduled appointment later this month due to " I am a casualty of the work situation due to [place] , I am laid off and will lose my insurance benefits after April "  This RN attempted to return call and obtained VM - message left informing pt appointment will not be canceled.  Note placed on appointments to not cancel.

## 2019-01-19 ENCOUNTER — Telehealth: Payer: Self-pay | Admitting: Oncology

## 2019-01-19 NOTE — Telephone Encounter (Signed)
Changed appt to webex per 4/20 sch message - unable to reach patient - left message with appt date and time

## 2019-01-21 ENCOUNTER — Telehealth: Payer: Self-pay | Admitting: Oncology

## 2019-01-21 NOTE — Telephone Encounter (Signed)
Left voicemail for patient regarding her Webex appointment. I told her told download the [employer] App onto one of her devices. And sent her join link to [email or web address]. Told her to click the green "join meeting" button and that would populate the appointment details. Would also prompt her to download the app if she hadn't already.   Told her if she was unable to do a video call with Dr. Jana Hakim, to call us back and we would convert it to a phone call visit.

## 2019-01-25 ENCOUNTER — Inpatient Hospital Stay: Payer: BLUE CROSS/BLUE SHIELD | Attending: Oncology | Admitting: Oncology

## 2019-01-25 ENCOUNTER — Inpatient Hospital Stay: Payer: BLUE CROSS/BLUE SHIELD

## 2019-01-25 DIAGNOSIS — C50112 Malignant neoplasm of central portion of left female breast: Secondary | ICD-10-CM

## 2019-01-25 DIAGNOSIS — C50111 Malignant neoplasm of central portion of right female breast: Secondary | ICD-10-CM

## 2019-01-25 DIAGNOSIS — M8580 Other specified disorders of bone density and structure, unspecified site: Secondary | ICD-10-CM

## 2019-01-25 DIAGNOSIS — Z79899 Other long term (current) drug therapy: Secondary | ICD-10-CM

## 2019-01-25 DIAGNOSIS — Z170 Estrogen receptor positive status [ER+]: Secondary | ICD-10-CM | POA: Diagnosis not present

## 2019-01-25 DIAGNOSIS — Z79810 Long term (current) use of selective estrogen receptor modulators (SERMs): Secondary | ICD-10-CM | POA: Insufficient documentation

## 2019-01-25 DIAGNOSIS — C50911 Malignant neoplasm of unspecified site of right female breast: Secondary | ICD-10-CM | POA: Insufficient documentation

## 2019-01-25 DIAGNOSIS — Z923 Personal history of irradiation: Secondary | ICD-10-CM | POA: Insufficient documentation

## 2019-01-25 DIAGNOSIS — C50012 Malignant neoplasm of nipple and areola, left female breast: Secondary | ICD-10-CM

## 2019-01-25 DIAGNOSIS — C50912 Malignant neoplasm of unspecified site of left female breast: Secondary | ICD-10-CM | POA: Insufficient documentation

## 2019-01-25 DIAGNOSIS — C50011 Malignant neoplasm of nipple and areola, right female breast: Secondary | ICD-10-CM

## 2019-01-25 DIAGNOSIS — Z9013 Acquired absence of bilateral breasts and nipples: Secondary | ICD-10-CM

## 2019-01-25 NOTE — Progress Notes (Signed)
ID: SAESHA LLERENAS   DOB: 1956-03-12  MR#: [id number]  VOZ#:[id number]  PCP: [name], MD GYN:  Uses PCP SU: Dr. Margot Chimes Other: Dr. 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   She continues on tamoxifen, with good tolerance. She still has occasional hot flashes, but she takes venlafaxine to aid this. She denies any vaginal discharge.  Since her last visit, she has not undergone any additional studies relating to her cancer.   REVIEW OF SYSTEMS: Sandra [name] reports she and her mother moved in together last year. She states she has been walking and doing yard work, less than before the pandemic lock-down began. She works from home, but she does the shopping for herself and her mother. The patient denies unusual headaches, visual changes, nausea, vomiting, stiff neck, dizziness, or gait imbalance. There has been no cough, phlegm production, or pleurisy, no chest pain or pressure, and no change in bowel or bladder habits. The patient denies fever, rash, bleeding, unexplained fatigue or unexplained weight loss. A detailed review of systems was otherwise entirely negative.   BREAST CANCER HISTORY  From the original intake note:   Sandra [name] is a 63 year old [place] woman, who had a screening mammogram performed on 12/14/2009 which showed a spiculated mass 1 o'clock position.  Physical exam at that time showed a large firm palpable mass upper aspect left breast at 1 o'clock.  No palpable lymph nodes were seen.  Ultrasound showed a large ill-defined irregular hypoechoic mass 1 o'clock position measuring 3.0 x 2.5 x 1.8 cm.  Biopsy was recommended and was performed on 12/14/2009.  This was felt to be an invasive ductal cancer, ER/PR positive 99% and 95% respectively, proliferative index 8%, HER-2 was non amplified.  MRI scan performed on the left  breast 12/20/2009 showed a mass measuring 2.1 x 2.1 x 2.5 cm in the left breast.  In the upper inner quadrant of the right breast there is an ill-defined lung mass enhancement measuring 2.5 x 2.3 cm.  No ultrasound correlate was identified for this lesion on the left and so an MRI-guided biopsy was scheduled.  The MRI guided biopsy of the right breast was performed on 12/27/09 resulting invasive ductal carcinoma with ductal carcinoma in situ.  She had a CT scan and PET on 01/02/10 which revealed a 4 mm lung nodule with no evidence of metastasis.  She began neoadjuvant chemotherapy beginning with dose dense FEC for 4 cycles from 01/11/10 to 02/22/10 followed by 1 cycle of dose dense Taxotere with poor tolerance and development of PPE.  She was then switched to Abraxane 3 weeks on with one week off for 2 cycles from 03/22/10 to 04/26/10.  On 06/27/10, she underwent bilateral simple mastectomies with bilateral sentinel lymph node dissection and expander placement with final pathology resulting right-sided ypTis, ypN0, ER/PR positive, HER-2 negative breast cancer and left sided ypT2, ypN1 invasive ductal cancer.  She underwent radiation therapy under the care of Dr. Pablo Ledger from 09/10/10 to 11/02/10.  She was started on Tamoxifen in February 2012 and has tolerated it well since.  Her subsequent history is as detailed above.    PAST MEDICAL HISTORY: Past Medical History:  Diagnosis Date  . Cancer (HCC)    breast  . Chicken pox   . GERD (gastroesophageal reflux disease)   . History of blood transfusion  PAST SURGICAL HISTORY: Past Surgical History:  Procedure Laterality Date  . AXILLARY LYMPH NODE DISSECTION  08/06/2010   left   . BREAST RECONSTRUCTION  08/15/2011   Procedure: BREAST RECONSTRUCTION;  Surgeon: Theodoro Kos, DO;  Location: [place];  Service: Plastics;  Laterality: Bilateral;  bilateral breast implant exchange  . BREAST RECONSTRUCTION  10/04/2011   Procedure:  BREAST RECONSTRUCTION;  Surgeon: Theodoro Kos, DO;  Location: [place];  Service: Plastics;  Laterality: Left;  removal left breast implant with exchange for new implant  . MASTECTOMY  9/30 2011   bilat.  . TONSILLECTOMY  1960s    FAMILY HISTORY Family History  Problem Relation Age of Onset  . Anesthesia problems Mother        Nausea  . Heart disease Father   . Heart disease Maternal Grandfather   . Diabetes Paternal Grandfather   Her mother is living at age 73, as of 12/2018.  GYNECOLOGIC HISTORY:  G0, P0, menarche age 57.  She has a history of fibroids.  No history of hormone replacement therapy.  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       ADVANCED DIRECTIVES:  Living will and Healthcare POA In place  HEALTH MAINTENANCE: Social History   Tobacco Use  . Smoking status: Never Smoker  . Smokeless tobacco: Never Used  Substance Use Topics  . Alcohol use: Yes    Comment: mild  . Drug use: No     Colonoscopy:    PAP:  08/07/2012  Bone density:  09/02/12 resulting low bone mass  Lipid panel:  08/07/12  Allergies  Allergen Reactions  . Ibuprofen Nausea And Vomiting and Other (See Comments)    Becomes incoherent    Current Outpatient Medications  Medication Sig Dispense Refill  . Ascorbic Acid (VITAMIN C) 1000 MG tablet Take 1,000 mg by mouth daily.    . B Complex-C-E-Zn (BEC/ZINC PO) Take 1 tablet by mouth daily.    . calcium carbonate (OS-CAL) 600 MG TABS Take 600 mg by mouth daily.      . cetirizine (ZYRTEC) 10 MG tablet Take 10 mg by mouth daily.    . cholecalciferol (VITAMIN D) 400 UNITS TABS Take by mouth daily.      . fish oil-omega-3 fatty acids 1000 MG capsule Take 2 g by mouth daily.      . fluticasone (FLONASE) 50 MCG/ACT nasal spray Place 2 sprays into both nostrils daily. 16 g 2  . tamoxifen (NOLVADEX) 20 MG tablet TAKE 1 TABLET BY MOUTH EVERY DAY 90 tablet 9  . venlafaxine XR (EFFEXOR-XR) 75 MG 24  hr capsule TAKE 1 CAPSULE BY MOUTH DAILY WITH BREAKFAST 90 capsule 1   No current facility-administered medications for this visit.     OBJECTIVE: Middle-aged white woman who appears well  There were no vitals filed for this visit.   There is no height or weight on file to calculate BMI.  LAB RESULTS: Lab Results  Component Value Date   WBC 5.6 11/18/2018   NEUTROABS 4.1 11/18/2018   HGB 13.8 11/18/2018   HCT 41.3 11/18/2018   MCV 93.5 11/18/2018   PLT 229.0 11/18/2018      Chemistry      Component Value Date/Time   NA 140 11/20/2018 1611   NA 143 01/23/2017 1434   K 4.2 11/20/2018 1611   K 4.0 01/23/2017 1434   CL 102 11/20/2018 1611   CL 105 01/11/2013 7471  CO2 29 11/20/2018 1611   CO2 31 (H) 01/23/2017 1434   BUN 15 11/20/2018 1611   BUN 14.2 01/23/2017 1434   CREATININE 0.57 11/20/2018 1611   CREATININE 0.7 01/23/2017 1434      Component Value Date/Time   CALCIUM 9.2 11/20/2018 1611   CALCIUM 9.6 01/23/2017 1434   ALKPHOS 64 01/22/2018 1500   ALKPHOS 52 01/23/2017 1434   AST 23 01/22/2018 1500   AST 25 01/23/2017 1434   ALT 20 01/22/2018 1500   ALT 28 01/23/2017 1434   BILITOT 0.2 01/22/2018 1500   BILITOT 0.38 01/23/2017 1434       Lab Results  Component Value Date   LABCA2 22 06/12/2012    No components found for: [id number]  No results for input(s): INR in the last 168 hours.  Urinalysis No results found for: COLORURINE  STUDIES: No results found.   ASSESSMENT: 63 y.o. [place] woman: #1  S/P biopsy of the left breast in 11/2009 for a clinical T2 N0 IDC, ER 99%, PR 95%, HER2-, Ki67 8% and biopsy of the right breast on 12/27/09 for IDC with DCIS, ER 98%, PR 100%, Ki67 8%, HER2-.  #2  She began neoadjuvant chemotherapy beginning with dose dense FEC for 4 cycles from 01/11/10 to 02/22/10 followed by 1 cycle of dose dense Taxotere with poor tolerance and development of PPE.  She was then switched to Abraxane 3 weeks on with one week off for 2  cycles from 03/22/10 to 04/26/10.    #3  On 06/27/10, she underwent bilateral simple mastectomies with bilateral sentinel lymph node dissection and expander placement with final pathology resulting right-sided ypTis, ypN0, ER/PR positive, HER-2 negative breast cancer and left sided ypT2, ypN1 invasive ductal cancer.    #4  She underwent radiation therapy under the care of Dr. Pablo Ledger from 09/10/10 to 11/02/10.    #5  She was started on Tamoxifen in February 2012 and has tolerated it well since.  #6 osteopenia, bone density 01/16/2016 shows a T score of -1.9.  PLAN:  Kassadee is now 8-1/2 years out from definitive surgery for her breast cancer with no evidence of disease recurrence.  This is very favorable.  She is tolerating tamoxifen well and the plan is to continue the drug for total of 5 years.  I have encouraged her to exercise more by walking, which together with tamoxifen should help her bone density  We reviewed her labs from February which are favorable  She knows to call for any issue that may develop before her next visit here, which will be in 1 year.     Magrinat, Virgie Dad, MD  01/25/19 3:39 PM Medical Oncology and Hematology [facility] [address] [place], [place] [address] Tel. [phone]    Fax. [phone]   I, Wilburn Mylar, am acting as scribe for Dr. Virgie Dad. Magrinat.  I, Lurline Del MD, have reviewed the above documentation for accuracy and completeness, and I agree with the above.

## 2019-06-04 ENCOUNTER — Telehealth: Payer: Self-pay

## 2019-06-04 NOTE — Telephone Encounter (Signed)
Pt Sandra [name] stating she had some recent blood work with her routine physical that her PCP thought she should talk to Dr Jana Hakim about.  RN returned call to number provided. VM obtained.  Unable to leave msg d/t "your call cannot be completed at this time".

## 2019-06-19 ENCOUNTER — Other Ambulatory Visit: Payer: Self-pay | Admitting: Oncology

## 2019-07-15 ENCOUNTER — Other Ambulatory Visit: Payer: Self-pay | Admitting: Oncology

## 2020-01-14 ENCOUNTER — Other Ambulatory Visit: Payer: Self-pay | Admitting: *Deleted

## 2020-01-14 MED ORDER — VENLAFAXINE HCL ER 75 MG PO CP24: ORAL_CAPSULE | ORAL | 0 refills | Status: DC

## 2020-01-25 ENCOUNTER — Other Ambulatory Visit: Payer: Self-pay

## 2020-01-25 DIAGNOSIS — C50112 Malignant neoplasm of central portion of left female breast: Secondary | ICD-10-CM

## 2020-01-25 DIAGNOSIS — C50111 Malignant neoplasm of central portion of right female breast: Secondary | ICD-10-CM

## 2020-01-26 ENCOUNTER — Other Ambulatory Visit: Payer: Self-pay

## 2020-01-26 ENCOUNTER — Inpatient Hospital Stay: Payer: BLUE CROSS/BLUE SHIELD | Attending: Oncology

## 2020-01-26 ENCOUNTER — Inpatient Hospital Stay (HOSPITAL_BASED_OUTPATIENT_CLINIC_OR_DEPARTMENT_OTHER): Payer: BLUE CROSS/BLUE SHIELD | Admitting: Oncology

## 2020-01-26 VITALS — BP 99/73 | HR 72 | Temp 98.7°F | Resp 18 | Ht 68.0 in | Wt 153.3 lb

## 2020-01-26 DIAGNOSIS — K219 Gastro-esophageal reflux disease without esophagitis: Secondary | ICD-10-CM | POA: Insufficient documentation

## 2020-01-26 DIAGNOSIS — C50112 Malignant neoplasm of central portion of left female breast: Secondary | ICD-10-CM

## 2020-01-26 DIAGNOSIS — M8580 Other specified disorders of bone density and structure, unspecified site: Secondary | ICD-10-CM | POA: Insufficient documentation

## 2020-01-26 DIAGNOSIS — C50111 Malignant neoplasm of central portion of right female breast: Secondary | ICD-10-CM

## 2020-01-26 DIAGNOSIS — Z923 Personal history of irradiation: Secondary | ICD-10-CM | POA: Insufficient documentation

## 2020-01-26 DIAGNOSIS — Z79810 Long term (current) use of selective estrogen receptor modulators (SERMs): Secondary | ICD-10-CM | POA: Diagnosis not present

## 2020-01-26 DIAGNOSIS — C50911 Malignant neoplasm of unspecified site of right female breast: Secondary | ICD-10-CM | POA: Diagnosis not present

## 2020-01-26 DIAGNOSIS — C50912 Malignant neoplasm of unspecified site of left female breast: Secondary | ICD-10-CM

## 2020-01-26 DIAGNOSIS — Z170 Estrogen receptor positive status [ER+]: Secondary | ICD-10-CM

## 2020-01-26 LAB — CBC WITH DIFFERENTIAL (CANCER CENTER ONLY)
Abs Immature Granulocytes: 0.02 10*3/uL (ref 0.00–0.07)
Basophils Absolute: 0.1 10*3/uL (ref 0.0–0.1)
Basophils Relative: 1 %
Eosinophils Absolute: 0.1 10*3/uL (ref 0.0–0.5)
Eosinophils Relative: 1 %
HCT: 37.8 % (ref 36.0–46.0)
Hemoglobin: 12.3 g/dL (ref 12.0–15.0)
Immature Granulocytes: 0 %
Lymphocytes Relative: 18 %
Lymphs Abs: 1.2 10*3/uL (ref 0.7–4.0)
MCH: 31 pg (ref 26.0–34.0)
MCHC: 32.5 g/dL (ref 30.0–36.0)
MCV: 95.2 fL (ref 80.0–100.0)
Monocytes Absolute: 0.6 10*3/uL (ref 0.1–1.0)
Monocytes Relative: 9 %
Neutro Abs: 4.5 10*3/uL (ref 1.7–7.7)
Neutrophils Relative %: 71 %
Platelet Count: 225 10*3/uL (ref 150–400)
RBC: 3.97 MIL/uL (ref 3.87–5.11)
RDW: 12.7 % (ref 11.5–15.5)
WBC Count: 6.4 10*3/uL (ref 4.0–10.5)
nRBC: 0 % (ref 0.0–0.2)

## 2020-01-26 LAB — CMP (CANCER CENTER ONLY)
ALT: 22 U/L (ref 0–44)
AST: 23 U/L (ref 15–41)
Albumin: 3.9 g/dL (ref 3.5–5.0)
Alkaline Phosphatase: 52 U/L (ref 38–126)
Anion gap: 5 (ref 5–15)
BUN: 17 mg/dL (ref 8–23)
CO2: 30 mmol/L (ref 22–32)
Calcium: 8.9 mg/dL (ref 8.9–10.3)
Chloride: 106 mmol/L (ref 98–111)
Creatinine: 0.74 mg/dL (ref 0.44–1.00)
GFR, Est AFR Am: >60 mL/min (ref >60)
GFR, Estimated: >60 mL/min (ref >60)
Glucose, Bld: 93 mg/dL (ref 70–99)
Potassium: 3.9 mmol/L (ref 3.5–5.1)
Sodium: 141 mmol/L (ref 135–145)
Total Bilirubin: 0.3 mg/dL (ref 0.3–1.2)
Total Protein: 6.4 g/dL — ABNORMAL LOW (ref 6.5–8.1)

## 2020-01-26 MED ORDER — VENLAFAXINE HCL ER 75 MG PO CP24: ORAL_CAPSULE | ORAL | 4 refills | Status: DC

## 2020-01-26 MED ORDER — TAMOXIFEN CITRATE 20 MG PO TABS: 20.0000 mg | ORAL_TABLET | Freq: Every day | ORAL | 4 refills | Status: DC

## 2020-01-26 NOTE — Progress Notes (Signed)
ID: [name]   DOB: 24-Apr-1956  MR#: [id number]  DPO#:[id number]  Patient Care Team: Joya Gaskins, FNP as PCP - General (Family Medicine) Other: Dr. 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   She continues on tamoxifen.  She tells me venlafaxine has helped with the hot flashes and they are not a major issue.  She does not have significant issues with vaginal wetness..  Since her last visit, she has not undergone any additional studies relating to her cancer.  She did undergo Cologuard testing on 07/03/2019 at [facility], which was positive.  She was initially scheduled to have this done in [facility] but for some reason it got delayed and then insurance refused to approve it which seems very strange to me.  This remains a source of concern  REVIEW OF SYSTEMS: Sandra [name]'s mother is now 12 and she is doing very well.  Sandra [name] exercises by walking at least 2 miles most days and she also does yard work.  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  A detailed review of systems today was otherwise stable and in particular has been unexplained weight loss or unexplained fatigue, and no change in bowel or bladder habits.   BREAST CANCER HISTORY  From the original intake note:   Sandra [name] is a 65 year old [place] woman, who had a screening mammogram performed on 12/14/2009 which showed a spiculated mass 1 o'clock position.  Physical exam at that time showed a large firm palpable mass upper aspect left breast at 1 o'clock.  No palpable lymph nodes were seen.  Ultrasound showed a large ill-defined irregular hypoechoic mass 1 o'clock position measuring 3.0 x 2.5 x 1.8 cm.  Biopsy was recommended and  was performed on 12/14/2009.  This was felt to be an invasive ductal cancer, ER/PR positive 99% and 95% respectively, proliferative index 8%, HER-2 was non amplified.  MRI scan performed on the left breast 12/20/2009 showed a mass measuring 2.1 x 2.1 x 2.5 cm in the left breast.  In the upper inner quadrant of the right breast there is an ill-defined lung mass enhancement measuring 2.5 x 2.3 cm.  No ultrasound correlate was identified for this lesion on the left and so an MRI-guided biopsy was scheduled.  The MRI guided biopsy of the right breast was performed on 12/27/09 resulting invasive ductal carcinoma with ductal carcinoma in situ.  She had a CT scan and PET on 01/02/10 which revealed a 4 mm lung nodule with no evidence of metastasis.  She began neoadjuvant chemotherapy beginning with dose dense FEC for 4 cycles from 01/11/10 to 02/22/10 followed by 1 cycle of dose dense Taxotere with poor tolerance and development of PPE.  She was then switched to Abraxane 3 weeks on with one week off for 2 cycles from 03/22/10 to 04/26/10.  On 06/27/10, she underwent bilateral simple mastectomies with bilateral sentinel lymph node dissection and expander placement with final pathology resulting right-sided ypTis, ypN0, ER/PR positive, HER-2 negative breast cancer and left sided ypT2, ypN1 invasive ductal cancer.  She underwent radiation therapy under the care of Dr. Pablo Ledger from 09/10/10 to 11/02/10.  She was started on Tamoxifen in February 2012  and has tolerated it well since.  Her subsequent history is as detailed above.    PAST MEDICAL HISTORY: Past Medical History:  Diagnosis Date  . Cancer (HCC)    breast  . Chicken pox   . GERD (gastroesophageal reflux disease)   . History of blood transfusion     PAST SURGICAL HISTORY: Past Surgical History:  Procedure Laterality Date  . AXILLARY LYMPH NODE DISSECTION  08/06/2010   left   . BREAST RECONSTRUCTION  08/15/2011   Procedure: BREAST RECONSTRUCTION;   Surgeon: Theodoro Kos, DO;  Location: [place];  Service: Plastics;  Laterality: Bilateral;  bilateral breast implant exchange  . BREAST RECONSTRUCTION  10/04/2011   Procedure: BREAST RECONSTRUCTION;  Surgeon: Theodoro Kos, DO;  Location: [place];  Service: Plastics;  Laterality: Left;  removal left breast implant with exchange for new implant  . MASTECTOMY  9/30 2011   bilat.  . TONSILLECTOMY  1960s    FAMILY HISTORY Family History  Problem Relation Age of Onset  . Anesthesia problems Mother        Nausea  . Heart disease Father   . Heart disease Maternal Grandfather   . Diabetes Paternal Grandfather   Her mother is living at age 63, as of 12/2018.   GYNECOLOGIC HISTORY:   G0, P0, menarche age 4.  She has a history of fibroids.  No history of hormone replacement therapy.   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       ADVANCED DIRECTIVES:  Living will and Healthcare POA In place   HEALTH MAINTENANCE: Social History   Tobacco Use  . Smoking status: Never Smoker  . Smokeless tobacco: Never Used  Substance Use Topics  . Alcohol use: Yes    Comment: mild  . Drug use: No     Colonoscopy:  to be scheduled -- positive Cologuard 07/2019   PAP:  08/07/2012  Bone density:  12/2015, -1.9   Allergies  Allergen Reactions  . Ibuprofen Nausea And Vomiting and Other (See Comments)    Becomes incoherent    Current Outpatient Medications  Medication Sig Dispense Refill  . Ascorbic Acid (VITAMIN C) 1000 MG tablet Take 1,000 mg by mouth daily.    . B Complex-C-E-Zn (BEC/ZINC PO) Take 1 tablet by mouth daily.    . calcium carbonate (OS-CAL) 600 MG TABS Take 600 mg by mouth daily.      . cetirizine (ZYRTEC) 10 MG tablet Take 10 mg by mouth daily.    . cholecalciferol (VITAMIN D) 400 UNITS TABS Take by mouth daily.      . fish oil-omega-3 fatty acids 1000 MG capsule Take 2 g by mouth daily.      .  fluticasone (FLONASE) 50 MCG/ACT nasal spray Place 2 sprays into both nostrils daily. 16 g 2  . tamoxifen (NOLVADEX) 20 MG tablet TAKE 1 TABLET BY MOUTH EVERY DAY 90 tablet 9  . venlafaxine XR (EFFEXOR-XR) 75 MG 24 hr capsule TAKE 1 CAPSULE BY MOUTH DAILY WITH BREAKFAST 90 capsule 0   No current facility-administered medications for this visit.    OBJECTIVE:  white woman who appears younger than stated age  68:   01/26/20 1541  BP: 99/73  Pulse: 72  Resp: 18  Temp: 98.7 F (37.1 C)  SpO2: 100%     Body mass index is 23.31 kg/m.  Sclerae unicteric, EOMs intact Wearing a mask No cervical or supraclavicular adenopathy Lungs  no rales or rhonchi Heart regular rate and rhythm Abd soft, nontender, positive bowel sounds MSK no focal spinal tenderness, no upper extremity lymphedema Neuro: nonfocal, well oriented, appropriate affect Breasts: Status post bilateral mastectomy with bilateral implant reconstruction.  There is no evidence of local recurrence.  Both axillae are benign.    LAB RESULTS: Lab Results  Component Value Date   WBC 6.4 01/26/2020   NEUTROABS 4.5 01/26/2020   HGB 12.3 01/26/2020   HCT 37.8 01/26/2020   MCV 95.2 01/26/2020   PLT 225 01/26/2020      Chemistry      Component Value Date/Time   NA 141 01/26/2020 1455   NA 143 01/23/2017 1434   K 3.9 01/26/2020 1455   K 4.0 01/23/2017 1434   CL 106 01/26/2020 1455   CL 105 01/11/2013 0937   CO2 30 01/26/2020 1455   CO2 31 (H) 01/23/2017 1434   BUN 17 01/26/2020 1455   BUN 14.2 01/23/2017 1434   CREATININE 0.74 01/26/2020 1455   CREATININE 0.57 11/20/2018 1611   CREATININE 0.7 01/23/2017 1434      Component Value Date/Time   CALCIUM 8.9 01/26/2020 1455   CALCIUM 9.6 01/23/2017 1434   ALKPHOS 52 01/26/2020 1455   ALKPHOS 52 01/23/2017 1434   AST 23 01/26/2020 1455   AST 25 01/23/2017 1434   ALT 22 01/26/2020 1455   ALT 28 01/23/2017 1434   BILITOT 0.3 01/26/2020 1455   BILITOT 0.38 01/23/2017  1434       Lab Results  Component Value Date   LABCA2 22 06/12/2012    No components found for: [id number]  No results for input(s): INR in the last 168 hours.  Urinalysis No results found for: COLORURINE   STUDIES: No results found.   ASSESSMENT: 64 y.o. [place] woman:  #1  S/P biopsy of the left breast in 11/2009 for a clinical T2 N0 IDC, ER 99%, PR 95%, HER2-, Ki67 8% and biopsy of the right breast on 12/27/09 for IDC with DCIS, ER 98%, PR 100%, Ki67 8%, HER2-.  #2  neoadjuvant chemotherapy beginning with dose dense FEC for 4 cycles from 01/11/10 to 02/22/10 followed by 1 cycle of dose dense Taxotere with poor tolerance and development of PPE.  She was then switched to Abraxane 3 weeks on with one week off for 2 cycles from 03/22/10 to 04/26/10.    #3  On 06/27/10, she underwent bilateral simple mastectomies with bilateral sentinel lymph node dissection and expander placement with final pathology resulting   (a) right-sided ypTis, ypN0, ER/PR positive, HER-2 negative breast cancer and   (b) left sided ypT2, ypN1 invasive ductal cancer.    #4  radiation therapy under Dr. Pablo Ledger 09/10/2010 to 11/02/2010.    #5  started Tamoxifen February 2012, plan for total of 10 years  #6 osteopenia, bone density 01/16/2016 shows a T score of -1.9.  #7  positive Cologuard October 2020  PLAN:  Ann is now 9-1/2 years out from definitive surgery for her breast cancer with no evidence of disease recurrence.  This is very favorable.  She is tolerating tamoxifen well and the plan will be to continue a total of 10 years, which will take [place] to the early spring 2022  She is concerned as I am I regarding the positive Cologuard.  She was quoted a 16,000 out-of-pocket cost for colonoscopy which is not affordable particularly since she is not working right now.  I urged her to continue to work with  her insurance company to see if a better deal can be arranged.  Unfortunately the [facility] system is  not in her insurance network  She will see me 1 more time early next year at which point she will be ready to "graduate".  Total encounter time 25 minutes.*   Sheralyn Pinegar, Virgie Dad, MD  01/26/20 4:20 PM Medical Oncology and Hematology [facility] [place], [place] [address] Tel. [phone]    Fax. [phone]   I, Wilburn Mylar, am acting as scribe for Dr. Virgie Dad. Divine Hansley.  I, Lurline Del MD, have reviewed the above documentation for accuracy and completeness, and I agree with the above.   *Total Encounter Time as defined by the Centers for Medicare and Medicaid Services includes, in addition to the face-to-face time of a patient visit (documented in the note above) non-face-to-face time: obtaining and reviewing outside history, ordering and reviewing medications, tests or procedures, care coordination (communications with other health care professionals or caregivers) and documentation in the medical record.

## 2020-05-18 ENCOUNTER — Encounter: Payer: Self-pay | Admitting: Genetic Counselor

## 2021-01-24 ENCOUNTER — Inpatient Hospital Stay: Payer: BLUE CROSS/BLUE SHIELD

## 2021-01-24 ENCOUNTER — Inpatient Hospital Stay: Payer: BLUE CROSS/BLUE SHIELD | Admitting: Oncology

## 2021-01-24 ENCOUNTER — Telehealth: Payer: Self-pay | Admitting: Oncology

## 2021-01-24 NOTE — Telephone Encounter (Signed)
R/s appt per 4/27 sch msg. Called pt, no answer. Left msg with appts date and times.

## 2021-01-24 NOTE — Progress Notes (Incomplete)
ID: Sandra [name]   DOB: [birth year]  MR#: [id number]  OYD#:[id number]  Patient Care Team: Joya Gaskins, FNP as PCP - General (Family Medicine) Hale Bogus., MD as Referring Physician (Gastroenterology) Other: Dr. 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   She continues on tamoxifen.  She tells me venlafaxine has helped with the hot flashes and they are not a major issue.  She does not have significant issues with vaginal wetness.  Since her last visit, she has not undergone any additional studies relating to her cancer.   REVIEW OF SYSTEMS: Sandra [name]    COVID 19 VACCINATION STATUS:    BREAST CANCER HISTORY  From the original intake note:   Sandra [name] is a 65 year old [place] woman, who had a screening mammogram performed on 12/14/2009 which showed a spiculated mass 1 o'clock position.  Physical exam at that time showed a large firm palpable mass upper aspect left breast at 1 o'clock.  No palpable lymph nodes were seen.  Ultrasound showed a large ill-defined irregular hypoechoic mass 1 o'clock position measuring 3.0 x 2.5 x 1.8 cm.  Biopsy was recommended and was performed on 12/14/2009.  This was felt to be an invasive ductal cancer, ER/PR positive 99% and 95% respectively, proliferative index 8%, HER-2 was non amplified.  MRI scan performed on the left breast 12/20/2009 showed a mass measuring 2.1 x 2.1 x 2.5 cm in the left breast.  In the upper inner quadrant of the right breast there is an ill-defined lung mass enhancement measuring 2.5 x 2.3 cm.  No ultrasound correlate was identified for this lesion on the left and so an MRI-guided biopsy was scheduled.  The MRI guided biopsy of the right breast was performed on 12/27/09 resulting invasive ductal carcinoma with ductal carcinoma in situ.  She had a  CT scan and PET on 01/02/10 which revealed a 4 mm lung nodule with no evidence of metastasis.  She began neoadjuvant chemotherapy beginning with dose dense FEC for 4 cycles from 01/11/10 to 02/22/10 followed by 1 cycle of dose dense Taxotere with poor tolerance and development of PPE.  She was then switched to Abraxane 3 weeks on with one week off for 2 cycles from 03/22/10 to 04/26/10.  On 06/27/10, she underwent bilateral simple mastectomies with bilateral sentinel lymph node dissection and expander placement with final pathology resulting right-sided ypTis, ypN0, ER/PR positive, HER-2 negative breast cancer and left sided ypT2, ypN1 invasive ductal cancer.  She underwent radiation therapy under the care of Dr. Pablo Ledger from 09/10/10 to 11/02/10.  She was started on Tamoxifen in February 2012 and has tolerated it well since.  Her subsequent history is as detailed above.    PAST MEDICAL HISTORY: Past Medical History:  Diagnosis Date  . Cancer (HCC)    breast  . Chicken pox   . GERD (gastroesophageal reflux disease)   . History of blood transfusion     PAST SURGICAL HISTORY: Past Surgical History:  Procedure Laterality Date  . AXILLARY LYMPH NODE DISSECTION  08/06/2010   left   . BREAST RECONSTRUCTION  08/15/2011   Procedure: BREAST RECONSTRUCTION;  Surgeon: Theodoro Kos, DO;  Location: [facility];  Service: Plastics;  Laterality: Bilateral;  bilateral breast implant exchange  . BREAST RECONSTRUCTION  10/04/2011   Procedure: BREAST RECONSTRUCTION;  Surgeon: Theodoro Kos,  DO;  Location: [place];  Service: Plastics;  Laterality: Left;  removal left breast implant with exchange for new implant  . MASTECTOMY  9/30 2011   bilat.  . TONSILLECTOMY  1960s    FAMILY HISTORY Family History  Problem Relation Age of Onset  . Anesthesia problems Mother        Nausea  . Heart disease Father   . Heart disease Maternal Grandfather   . Diabetes Paternal Grandfather    Her mother is living at age 70, as of 12/2018.   GYNECOLOGIC HISTORY:   G0, P0, menarche age 16.  She has a history of fibroids.  No history of hormone replacement therapy.   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     ADVANCED DIRECTIVES:  Living will and Healthcare POA In place   HEALTH MAINTENANCE: Social History   Tobacco Use  . Smoking status: Never Smoker  . Smokeless tobacco: Never Used  Substance Use Topics  . Alcohol use: Yes    Comment: mild  . Drug use: No     Colonoscopy:  to be scheduled -- positive Cologuard 07/2019   PAP:  08/07/2012  Bone density:  12/2015, -1.9   Allergies  Allergen Reactions  . Ibuprofen Nausea And Vomiting and Other (See Comments)    Becomes incoherent    Current Outpatient Medications  Medication Sig Dispense Refill  . Ascorbic Acid (VITAMIN C) 1000 MG tablet Take 1,000 mg by mouth daily.    . B Complex-C-E-Zn (BEC/ZINC PO) Take 1 tablet by mouth daily.    . calcium carbonate (OS-CAL) 600 MG TABS Take 600 mg by mouth daily.      . cetirizine (ZYRTEC) 10 MG tablet Take 10 mg by mouth daily.    . cholecalciferol (VITAMIN D) 400 UNITS TABS Take by mouth daily.      . fish oil-omega-3 fatty acids 1000 MG capsule Take 2 g by mouth daily.      . fluticasone (FLONASE) 50 MCG/ACT nasal spray Place 2 sprays into both nostrils daily. 16 g 2  . tamoxifen (NOLVADEX) 20 MG tablet Take 1 tablet (20 mg total) by mouth daily. 90 tablet 4  . venlafaxine XR (EFFEXOR-XR) 75 MG 24 hr capsule TAKE 1 CAPSULE BY MOUTH DAILY WITH BREAKFAST 90 capsule 4   No current facility-administered medications for this visit.    OBJECTIVE:  white woman who appears younger than stated age  There were no vitals filed for this visit.   There is no height or weight on file to calculate BMI.  Sclerae unicteric, EOMs intact Wearing a mask No cervical or supraclavicular adenopathy Lungs no rales or rhonchi Heart regular rate  and rhythm Abd soft, nontender, positive bowel sounds MSK no focal spinal tenderness, no upper extremity lymphedema Neuro: nonfocal, well oriented, appropriate affect Breasts:    {Sclerae unicteric, EOMs intact Wearing a mask No cervical or supraclavicular adenopathy Lungs no rales or rhonchi Heart regular rate and rhythm Abd soft, nontender, positive bowel sounds MSK no focal spinal tenderness, no upper extremity lymphedema Neuro: nonfocal, well oriented, appropriate affect Breasts: Status post bilateral mastectomy with bilateral implant reconstruction.  There is no evidence of local recurrence.  Both axillae are benign.}   LAB RESULTS: Lab Results  Component Value Date   WBC 6.4 01/26/2020   NEUTROABS 4.5 01/26/2020   HGB 12.3 01/26/2020   HCT 37.8 01/26/2020   MCV 95.2 01/26/2020   PLT 225  01/26/2020      Chemistry      Component Value Date/Time   NA 141 01/26/2020 1455   NA 143 01/23/2017 1434   K 3.9 01/26/2020 1455   K 4.0 01/23/2017 1434   CL 106 01/26/2020 1455   CL 105 01/11/2013 0937   CO2 30 01/26/2020 1455   CO2 31 (H) 01/23/2017 1434   BUN 17 01/26/2020 1455   BUN 14.2 01/23/2017 1434   CREATININE 0.74 01/26/2020 1455   CREATININE 0.57 11/20/2018 1611   CREATININE 0.7 01/23/2017 1434      Component Value Date/Time   CALCIUM 8.9 01/26/2020 1455   CALCIUM 9.6 01/23/2017 1434   ALKPHOS 52 01/26/2020 1455   ALKPHOS 52 01/23/2017 1434   AST 23 01/26/2020 1455   AST 25 01/23/2017 1434   ALT 22 01/26/2020 1455   ALT 28 01/23/2017 1434   BILITOT 0.3 01/26/2020 1455   BILITOT 0.38 01/23/2017 1434       Lab Results  Component Value Date   LABCA2 22 06/12/2012    No components found for: LPFXT024  No results for input(s): INR in the last 168 hours.  Urinalysis No results found for: COLORURINE   STUDIES: No results found.   ASSESSMENT: 65 y.o. [place] woman:  #1  S/P biopsy of the left breast in 11/2009 for a clinical T2 N0 IDC, ER  99%, PR 95%, HER2-, Ki67 8% and biopsy of the right breast on 12/27/09 for IDC with DCIS, ER 98%, PR 100%, Ki67 8%, HER2-.  #2  neoadjuvant chemotherapy beginning with dose dense FEC for 4 cycles from 01/11/10 to 02/22/10 followed by 1 cycle of dose dense Taxotere with poor tolerance and development of PPE.  She was then switched to Abraxane 3 weeks on with one week off for 2 cycles from 03/22/10 to 04/26/10.    #3  On 06/27/10, she underwent bilateral simple mastectomies with bilateral sentinel lymph node dissection and expander placement with final pathology resulting   (a) right-sided ypTis, ypN0, ER/PR positive, HER-2 negative breast cancer and   (b) left sided ypT2, ypN1 invasive ductal cancer.    #4  radiation therapy under Dr. Pablo Ledger 09/10/2010 to 11/02/2010.    #5  started Tamoxifen February 2012, plan for total of 10 years  #6 osteopenia, bone density 01/16/2016 shows a T score of -1.9.  #7  positive Cologuard October 2020   PLAN:  Elias is now 9-1/2 years out from definitive surgery for her breast cancer with no evidence of disease recurrence.  This is very favorable.  She is tolerating tamoxifen well and the plan will be to continue a total of 10 years, which will take [place] to the early spring 2022  She is concerned as I am I regarding the positive Cologuard.  She was quoted a 16,000 out-of-pocket cost for colonoscopy which is not affordable particularly since she is not working right now.  I urged her to continue to work with her insurance company to see if a better deal can be arranged.  Unfortunately the [facility] system is not in her insurance network  She will see me 1 more time early next year at which point she will be ready to "graduate".  Total encounter time 25 minutes.*   Lillieanna Tuohy, Virgie Dad, MD  01/24/21 3:24 PM Medical Oncology and Hematology [facility] [place], [place] [address] Tel. [phone]    Fax. [phone]   I, Wilburn Mylar, am acting as scribe  for Dr. Sarajane Jews C. Leevon Upperman.  I, Lurline Del MD, have reviewed the above documentation for accuracy and completeness, and I agree with the above.   *Total Encounter Time as defined by the Centers for Medicare and Medicaid Services includes, in addition to the face-to-face time of a patient visit (documented in the note above) non-face-to-face time: obtaining and reviewing outside history, ordering and reviewing medications, tests or procedures, care coordination (communications with other health care professionals or caregivers) and documentation in the medical record.

## 2021-01-29 ENCOUNTER — Telehealth: Payer: Self-pay | Admitting: Oncology

## 2021-01-29 NOTE — Telephone Encounter (Signed)
Pt called and left msg for me to call her back about her appts. I called pt back, no answer. Left a msg for pt to call me back again if she wanted to go over her appts.

## 2021-02-02 ENCOUNTER — Telehealth: Payer: Self-pay | Admitting: Oncology

## 2021-02-02 NOTE — Telephone Encounter (Signed)
Pt called in to r/s appt. Pt is aware of updated appt date and time.

## 2021-02-06 ENCOUNTER — Other Ambulatory Visit: Payer: Self-pay | Admitting: Oncology

## 2021-03-27 ENCOUNTER — Ambulatory Visit: Payer: Self-pay | Admitting: Oncology

## 2021-03-27 ENCOUNTER — Other Ambulatory Visit: Payer: Self-pay

## 2021-04-04 NOTE — Progress Notes (Signed)
ID: [name]   DOB: 09/27/1956  MR#: [id number]  BPZ#:[id number]  Patient Care Team: Joya Gaskins, FNP as PCP - General (Family Medicine) Hale Bogus., MD as Referring Physician (Gastroenterology) Other: Dr. 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   She ran out of tamoxifen about 6 weeks ago, sometime in April, and knowing that she was coming off at completing 10 years she did not ask for refill.  She has not noted any significant changes going off the medication.  Possibly she is having fewer problems with vaginal wetness.  Since her last visit, she has not undergone any additional studies relating to her cancer.   REVIEW OF SYSTEMS: Sandra [name] continues to work in [employer].  She exercises by walking 1 or 2 miles about 3 times a week and doing some yard work.  Detailed review of systems was otherwise stable.   COVID 19 VACCINATION STATUS: Status post Pfizer x2 with 1 booster   BREAST CANCER HISTORY  From the original intake note:   Sandra [name] is a 65 year old [place] woman, who had a screening mammogram performed on 12/14/2009 which showed a spiculated mass 1 o'clock position.  Physical exam at that time showed a large firm palpable mass upper aspect left breast at 1 o'clock.  No palpable lymph nodes were seen.  Ultrasound showed a large ill-defined irregular hypoechoic mass 1 o'clock position measuring 3.0 x 2.5 x 1.8 cm.  Biopsy was recommended and was performed on 12/14/2009.  This was felt to be an invasive ductal cancer, ER/PR positive 99% and 95% respectively, proliferative index 8%, HER-2 was non amplified.  MRI scan performed on the left breast 12/20/2009 showed a mass measuring 2.1 x 2.1 x 2.5 cm in the left breast.  In the upper inner quadrant of the right breast there is an  ill-defined lung mass enhancement measuring 2.5 x 2.3 cm.  No ultrasound correlate was identified for this lesion on the left and so an MRI-guided biopsy was scheduled.  The MRI guided biopsy of the right breast was performed on 12/27/09 resulting invasive ductal carcinoma with ductal carcinoma in situ.  She had a CT scan and PET on 01/02/10 which revealed a 4 mm lung nodule with no evidence of metastasis.  She began neoadjuvant chemotherapy beginning with dose dense FEC for 4 cycles from 01/11/10 to 02/22/10 followed by 1 cycle of dose dense Taxotere with poor tolerance and development of PPE.  She was then switched to Abraxane 3 weeks on with one week off for 2 cycles from 03/22/10 to 04/26/10.  On 06/27/10, she underwent bilateral simple mastectomies with bilateral sentinel lymph node dissection and expander placement with final pathology resulting right-sided ypTis, ypN0, ER/PR positive, HER-2 negative breast cancer and left sided ypT2, ypN1 invasive ductal cancer.  She underwent radiation therapy under the care of Dr. Pablo Ledger from 09/10/10 to 11/02/10.  She was started on Tamoxifen in February 2012 and has tolerated it well since.  Her subsequent history is as detailed above.    PAST MEDICAL HISTORY: Past Medical History:  Diagnosis Date   Cancer ([place])    breast   Chicken pox    GERD (gastroesophageal reflux disease)    History of blood transfusion     PAST SURGICAL HISTORY: Past Surgical History:  Procedure Laterality Date   AXILLARY  LYMPH NODE DISSECTION  08/06/2010   left    BREAST RECONSTRUCTION  08/15/2011   Procedure: BREAST RECONSTRUCTION;  Surgeon: Theodoro Kos, DO;  Location: [place];  Service: Plastics;  Laterality: Bilateral;  bilateral breast implant exchange   BREAST RECONSTRUCTION  10/04/2011   Procedure: BREAST RECONSTRUCTION;  Surgeon: Theodoro Kos, DO;  Location: [place];  Service: Plastics;  Laterality: Left;  removal left breast  implant with exchange for new implant   MASTECTOMY  9/30 2011   bilat.   TONSILLECTOMY  1960s    FAMILY HISTORY Family History  Problem Relation Age of Onset   Anesthesia problems Mother        Nausea   Heart disease Father    Heart disease Maternal Grandfather    Diabetes Paternal Grandfather   Her mother is living at age 10, as of 12/2018.   GYNECOLOGIC HISTORY:   G0, P0, menarche age 41.  She has a history of fibroids.  No history of hormone replacement therapy.   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       ADVANCED DIRECTIVES:  Living will and Healthcare POA In place   HEALTH MAINTENANCE: Social History   Tobacco Use   Smoking status: Never   Smokeless tobacco: Never  Substance Use Topics   Alcohol use: Yes    Comment: mild   Drug use: No     Colonoscopy:  to be scheduled -- positive Cologuard 07/2019   PAP:  08/07/2012  Bone density:  12/2015, -1.9   Allergies  Allergen Reactions   Ibuprofen Nausea And Vomiting and Other (See Comments)    Becomes incoherent    Current Outpatient Medications  Medication Sig Dispense Refill   Ascorbic Acid (VITAMIN C) 1000 MG tablet Take 1,000 mg by mouth daily.     B Complex-C-E-Zn (BEC/ZINC PO) Take 1 tablet by mouth daily.     calcium carbonate (OS-CAL) 600 MG TABS Take 600 mg by mouth daily.       cetirizine (ZYRTEC) 10 MG tablet Take 10 mg by mouth daily.     cholecalciferol (VITAMIN D) 400 UNITS TABS Take by mouth daily.       fish oil-omega-3 fatty acids 1000 MG capsule Take 2 g by mouth daily.       fluticasone (FLONASE) 50 MCG/ACT nasal spray Place 2 sprays into both nostrils daily. 16 g 2   venlafaxine XR (EFFEXOR-XR) 75 MG 24 hr capsule Take 1 capsule (75 mg total) by mouth daily with breakfast. 90 capsule 4   No current facility-administered medications for this visit.    OBJECTIVE:  white woman who appears younger than stated age  65:   04/05/21 1419  BP:  121/75  Pulse: 100  Resp: 18  Temp: (!) 97.3 F (36.3 C)  SpO2: 95%      Body mass index is 24.63 kg/m.  Sclerae unicteric, EOMs intact Wearing a mask No cervical or supraclavicular adenopathy Lungs no rales or rhonchi Heart regular rate and rhythm Abd soft, nontender, positive bowel sounds MSK no focal spinal tenderness, no upper extremity lymphedema Neuro: nonfocal, well oriented, appropriate affect Breasts: Status post bilateral mastectomies with bilateral implant reconstruction.  There is no evidence of local recurrence.  Both axillae are benign   LAB RESULTS: Lab Results  Component Value Date   WBC 5.3 04/05/2021   NEUTROABS 3.6 04/05/2021   HGB 12.9 04/05/2021   HCT 38.0 04/05/2021  MCV 92.5 04/05/2021   PLT 222 04/05/2021      Chemistry      Component Value Date/Time   NA 143 04/05/2021 1410   NA 143 01/23/2017 1434   K 4.0 04/05/2021 1410   K 4.0 01/23/2017 1434   CL 104 04/05/2021 1410   CL 105 01/11/2013 0937   CO2 31 04/05/2021 1410   CO2 31 (H) 01/23/2017 1434   BUN 13 04/05/2021 1410   BUN 14.2 01/23/2017 1434   CREATININE 0.71 04/05/2021 1410   CREATININE 0.74 01/26/2020 1455   CREATININE 0.57 11/20/2018 1611   CREATININE 0.7 01/23/2017 1434      Component Value Date/Time   CALCIUM 9.8 04/05/2021 1410   CALCIUM 9.6 01/23/2017 1434   ALKPHOS 81 04/05/2021 1410   ALKPHOS 52 01/23/2017 1434   AST 40 04/05/2021 1410   AST 23 01/26/2020 1455   AST 25 01/23/2017 1434   ALT 49 (H) 04/05/2021 1410   ALT 22 01/26/2020 1455   ALT 28 01/23/2017 1434   BILITOT 0.4 04/05/2021 1410   BILITOT 0.3 01/26/2020 1455   BILITOT 0.38 01/23/2017 1434       Lab Results  Component Value Date   LABCA2 22 06/12/2012    No components found for: YTKPT465  No results for input(s): INR in the last 168 hours.  Urinalysis No results found for: COLORURINE   STUDIES: No results found.   ASSESSMENT: 65 y.o. [place] woman:  #1  S/P biopsy of the  left breast in 11/2009 for a clinical T2 N0 IDC, ER 99%, PR 95%, HER2-, Ki67 8% and biopsy of the right breast on 12/27/09 for IDC with DCIS, ER 98%, PR 100%, Ki67 8%, HER2-.  #2  neoadjuvant chemotherapy beginning with dose dense FEC for 4 cycles from 01/11/10 to 02/22/10 followed by 1 cycle of dose dense Taxotere with poor tolerance and development of PPE.  She was then switched to Abraxane 3 weeks on with one week off for 2 cycles from 03/22/10 to 04/26/10.    #3  On 06/27/10, she underwent bilateral simple mastectomies with bilateral sentinel lymph node dissection and expander placement with final pathology resulting   (a) right-sided ypTis, ypN0, ER/PR positive, HER-2 negative breast cancer and   (b) left sided ypT2, ypN1 invasive ductal cancer.    #4  radiation therapy under Dr. Pablo Ledger 09/10/2010 to 11/02/2010.    #5  started Tamoxifen February 2012, completing 10 years April 2022  #6 osteopenia, bone density 01/16/2016 shows a T score of -1.9.  #7  positive Cologuard October 2020-- colonoscopy pending   PLAN:  Sandra [name] is now nearly 11 years out from definitive surgery for her breast cancer with no evidence of disease recurrence.  This is very favorable.  She completed 10 years of tamoxifen earlier this year.  We do not have data that continuing beyond that point would be helpful.  Accordingly I am very comfortable with her going off that medication.  As far as breast cancer is concerned all she will need is yearly physician chest wall exam.  We still have that unresolved Cologuard issue.  She now has insurance.  I have referred her to the [facility] for colonoscopy.  She is very motivated to proceed with that test at this point  We discussed whether or not she wanted to get off the venlafaxine.  She understands if she wants to get off that medication she will have to slowly taper it off and we can give  her instructions on how to do that.  At this point however she prefers to  continue and I have refilled that prescription for her for another year.  After that she will seek further with fills from her primary care physician  I will be glad to see Jameia again at any point in the future if and when the need arises but as of now are making no further routine appointments for her here  Total encounter time 25 minutes.*  Jacque Byron, Virgie Dad, MD  04/05/21 2:48 PM Medical Oncology and Hematology [facility] [place], [place] [address] Tel. [phone]    Fax. [phone]   I, Wilburn Mylar, am acting as scribe for Dr. Virgie Dad. Ruchi Stoney.  I, Lurline Del MD, have reviewed the above documentation for accuracy and completeness, and I agree with the above.   *Total Encounter Time as defined by the Centers for Medicare and Medicaid Services includes, in addition to the face-to-face time of a patient visit (documented in the note above) non-face-to-face time: obtaining and reviewing outside history, ordering and reviewing medications, tests or procedures, care coordination (communications with other health care professionals or caregivers) and documentation in the medical record.

## 2021-04-05 ENCOUNTER — Inpatient Hospital Stay (HOSPITAL_BASED_OUTPATIENT_CLINIC_OR_DEPARTMENT_OTHER): Payer: No Typology Code available for payment source | Admitting: Oncology

## 2021-04-05 ENCOUNTER — Inpatient Hospital Stay: Payer: No Typology Code available for payment source | Attending: Oncology

## 2021-04-05 ENCOUNTER — Other Ambulatory Visit: Payer: Self-pay

## 2021-04-05 ENCOUNTER — Encounter: Payer: Self-pay | Admitting: Oncology

## 2021-04-05 VITALS — BP 121/75 | HR 100 | Temp 97.3°F | Resp 18 | Ht 68.0 in | Wt 162.0 lb

## 2021-04-05 DIAGNOSIS — C50812 Malignant neoplasm of overlapping sites of left female breast: Secondary | ICD-10-CM

## 2021-04-05 DIAGNOSIS — C50912 Malignant neoplasm of unspecified site of left female breast: Secondary | ICD-10-CM

## 2021-04-05 DIAGNOSIS — Z923 Personal history of irradiation: Secondary | ICD-10-CM | POA: Insufficient documentation

## 2021-04-05 DIAGNOSIS — M8580 Other specified disorders of bone density and structure, unspecified site: Secondary | ICD-10-CM | POA: Insufficient documentation

## 2021-04-05 DIAGNOSIS — C50811 Malignant neoplasm of overlapping sites of right female breast: Secondary | ICD-10-CM | POA: Diagnosis not present

## 2021-04-05 DIAGNOSIS — K219 Gastro-esophageal reflux disease without esophagitis: Secondary | ICD-10-CM | POA: Insufficient documentation

## 2021-04-05 DIAGNOSIS — R195 Other fecal abnormalities: Secondary | ICD-10-CM | POA: Diagnosis not present

## 2021-04-05 DIAGNOSIS — C50911 Malignant neoplasm of unspecified site of right female breast: Secondary | ICD-10-CM | POA: Diagnosis not present

## 2021-04-05 DIAGNOSIS — Z9221 Personal history of antineoplastic chemotherapy: Secondary | ICD-10-CM | POA: Insufficient documentation

## 2021-04-05 DIAGNOSIS — C50111 Malignant neoplasm of central portion of right female breast: Secondary | ICD-10-CM

## 2021-04-05 DIAGNOSIS — Z9013 Acquired absence of bilateral breasts and nipples: Secondary | ICD-10-CM | POA: Diagnosis not present

## 2021-04-05 DIAGNOSIS — Z170 Estrogen receptor positive status [ER+]: Secondary | ICD-10-CM

## 2021-04-05 DIAGNOSIS — Z853 Personal history of malignant neoplasm of breast: Secondary | ICD-10-CM | POA: Diagnosis not present

## 2021-04-05 LAB — COMPREHENSIVE METABOLIC PANEL
ALT: 49 U/L — ABNORMAL HIGH (ref 0–44)
AST: 40 U/L (ref 15–41)
Albumin: 4 g/dL (ref 3.5–5.0)
Alkaline Phosphatase: 81 U/L (ref 38–126)
Anion gap: 8 (ref 5–15)
BUN: 13 mg/dL (ref 8–23)
CO2: 31 mmol/L (ref 22–32)
Calcium: 9.8 mg/dL (ref 8.9–10.3)
Chloride: 104 mmol/L (ref 98–111)
Creatinine, Ser: 0.71 mg/dL (ref 0.44–1.00)
GFR, Estimated: >60 mL/min (ref >60)
Glucose, Bld: 123 mg/dL — ABNORMAL HIGH (ref 70–99)
Potassium: 4 mmol/L (ref 3.5–5.1)
Sodium: 143 mmol/L (ref 135–145)
Total Bilirubin: 0.4 mg/dL (ref 0.3–1.2)
Total Protein: 7 g/dL (ref 6.5–8.1)

## 2021-04-05 LAB — CBC WITH DIFFERENTIAL/PLATELET
Abs Immature Granulocytes: 0.03 10*3/uL (ref 0.00–0.07)
Basophils Absolute: 0 10*3/uL (ref 0.0–0.1)
Basophils Relative: 0 %
Eosinophils Absolute: 0.1 10*3/uL (ref 0.0–0.5)
Eosinophils Relative: 2 %
HCT: 38 % (ref 36.0–46.0)
Hemoglobin: 12.9 g/dL (ref 12.0–15.0)
Immature Granulocytes: 1 %
Lymphocytes Relative: 19 %
Lymphs Abs: 1 10*3/uL (ref 0.7–4.0)
MCH: 31.4 pg (ref 26.0–34.0)
MCHC: 33.9 g/dL (ref 30.0–36.0)
MCV: 92.5 fL (ref 80.0–100.0)
Monocytes Absolute: 0.5 10*3/uL (ref 0.1–1.0)
Monocytes Relative: 9 %
Neutro Abs: 3.6 10*3/uL (ref 1.7–7.7)
Neutrophils Relative %: 69 %
Platelets: 222 10*3/uL (ref 150–400)
RBC: 4.11 MIL/uL (ref 3.87–5.11)
RDW: 12.3 % (ref 11.5–15.5)
WBC: 5.3 10*3/uL (ref 4.0–10.5)
nRBC: 0 % (ref 0.0–0.2)

## 2021-04-05 MED ORDER — VENLAFAXINE HCL ER 75 MG PO CP24: 75.0000 mg | ORAL_CAPSULE | Freq: Every day | ORAL | 4 refills | Status: DC

## 2022-04-25 ENCOUNTER — Other Ambulatory Visit: Payer: Self-pay | Admitting: *Deleted

## 2022-04-25 MED ORDER — VENLAFAXINE HCL ER 75 MG PO CP24: 75.0000 mg | ORAL_CAPSULE | Freq: Every day | ORAL | 0 refills | Status: AC

## 2022-04-25 NOTE — Telephone Encounter (Signed)
This RN spoke with pt per her call stating she has been out of the venlafaxine for 5 days - " I thought my pharmacy would call to get refills ".  Note pt was released from care at last visit.  Per acute need refill will be sent for continuity of care- but pt understands further refills should come thru her primary care provider - verified as Patrecia Pace DNP at [facility].

## 2022-08-05 ENCOUNTER — Other Ambulatory Visit: Payer: Self-pay | Admitting: Family Medicine

## 2022-08-05 DIAGNOSIS — M8580 Other specified disorders of bone density and structure, unspecified site: Secondary | ICD-10-CM

## 2022-08-28 NOTE — Progress Notes (Unsigned)
New Patient Note  RE: Sandra [name] MRN: [id number] DOB: 09-08-1956 Date of Office Visit: 08/29/2022  Consult requested by: Joya Gaskins, * Primary care provider: Joya Gaskins, FNP  Chief Complaint: No chief complaint on file.  History of Present Illness: I had the pleasure of seeing Sandra [name] for initial evaluation at the [facility] on 08/28/2022. She is a 66 y.o. female, who is referred here by Joya Gaskins, FNP for the evaluation of food allergy.  She reports food allergy to ***. The reaction occurred at the age of ***, after she ate *** amount of ***. Symptoms started within *** and was in the form of *** hives, swelling, wheezing, abdominal pain, diarrhea, vomiting. ***Denies any associated cofactors such as exertion, infection, NSAID use, or alcohol consumption. The symptoms lasted for ***. She was evaluated in ED and received ***. Since this episode, she does *** not report other accidental exposures to ***. She does *** not have access to epinephrine autoinjector and *** needed to use it.   Past work up includes: ***. Dietary History: patient has been eating other foods including ***milk, ***eggs, ***peanut, ***treenuts, ***sesame, ***shellfish, ***fish, ***soy, ***wheat, ***meats, ***fruits and ***vegetables.  She reports reading labels and avoiding *** in diet completely. She tolerates ***baked egg and baked milk products.   Assessment and Plan: Sandra [name] is a 66 y.o. female with: No problem-specific Assessment & Plan notes found for this encounter.  No follow-ups on file.  No orders of the defined types were placed in this encounter.  Lab Orders  No laboratory test(s) ordered today    Other allergy screening: Asthma: {Blank single:19197::"yes","no"} Rhino conjunctivitis: {Blank single:19197::"yes","no"} Food allergy: {Blank single:19197::"yes","no"} Medication allergy: {Blank single:19197::"yes","no"} Hymenoptera allergy:  {Blank single:19197::"yes","no"} Urticaria: {Blank single:19197::"yes","no"} Eczema:{Blank single:19197::"yes","no"} History of recurrent infections suggestive of immunodeficency: {Blank single:19197::"yes","no"}  Diagnostics: Spirometry:  Tracings reviewed. Her effort: {Blank single:19197::"Good reproducible efforts.","It was hard to get consistent efforts and there is a question as to whether this reflects a maximal maneuver.","Poor effort, data can not be interpreted."} FVC: ***L FEV1: ***L, ***% predicted FEV1/FVC ratio: ***% Interpretation: {Blank single:19197::"Spirometry consistent with mild obstructive disease","Spirometry consistent with moderate obstructive disease","Spirometry consistent with severe obstructive disease","Spirometry consistent with possible restrictive disease","Spirometry consistent with mixed obstructive and restrictive disease","Spirometry uninterpretable due to technique","Spirometry consistent with normal pattern","No overt abnormalities noted given today's efforts"}.  Please see scanned spirometry results for details.  Skin Testing: {Blank single:19197::"Select foods","Environmental allergy panel","Environmental allergy panel and select foods","Food allergy panel","None","Deferred due to recent antihistamines use"}. *** Results discussed with patient/family.   Past Medical History: Patient Active Problem List   Diagnosis Date Noted  . Bilateral malignant neoplasm of breast in female, estrogen receptor positive ([facility]) 01/22/2018  . Hot flashes 01/20/2015  . Patellofemoral stress syndrome of right knee 07/19/2014  . 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 Other malaise and fatigue 01/21/2014  . Sinusitis, acute maxillary 05/13/2013  . Facial dermatitis 09/07/2012  . Screening for malignant neoplasm of cervix 08/07/2012  . Encounter for routine gynecological examination 08/07/2012  . Uterine fibroid 08/07/2012  . Osteopenia 07/20/2012  . GERD  (gastroesophageal reflux disease) 07/20/2012  . Plantar fasciitis 07/20/2012  . Bilateral breast cancer ([facility]) 08/15/2011  . History of breast reconstruction 08/15/2011   Past Medical History:  Diagnosis Date  . Cancer (HCC)    breast  . Chicken pox   . GERD (gastroesophageal reflux disease)   . History of blood transfusion    Past  Surgical History: Past Surgical History:  Procedure Laterality Date  . AXILLARY LYMPH NODE DISSECTION  08/06/2010   left   . BREAST RECONSTRUCTION  08/15/2011   Procedure: BREAST RECONSTRUCTION;  Surgeon: Theodoro Kos, DO;  Location: [facility];  Service: Plastics;  Laterality: Bilateral;  bilateral breast implant exchange  . BREAST RECONSTRUCTION  10/04/2011   Procedure: BREAST RECONSTRUCTION;  Surgeon: Theodoro Kos, DO;  Location: [facility];  Service: Plastics;  Laterality: Left;  removal left breast implant with exchange for new implant  . MASTECTOMY  9/30 2011   bilat.  . TONSILLECTOMY  1960s   Medication List:  Current Outpatient Medications  Medication Sig Dispense Refill  . Ascorbic Acid (VITAMIN C) 1000 MG tablet Take 1,000 mg by mouth daily.    . B Complex-C-E-Zn (BEC/ZINC PO) Take 1 tablet by mouth daily.    . calcium carbonate (OS-CAL) 600 MG TABS Take 600 mg by mouth daily.      . cetirizine (ZYRTEC) 10 MG tablet Take 10 mg by mouth daily.    . cholecalciferol (VITAMIN D) 400 UNITS TABS Take by mouth daily.      . fish oil-omega-3 fatty acids 1000 MG capsule Take 2 g by mouth daily.      . fluticasone (FLONASE) 50 MCG/ACT nasal spray Place 2 sprays into both nostrils daily. 16 g 2  . venlafaxine XR (EFFEXOR-XR) 75 MG 24 hr capsule Take 1 capsule (75 mg total) by mouth daily with breakfast. 90 capsule 0   No current facility-administered medications for this visit.   Allergies: Allergies  Allergen Reactions  . Ibuprofen Nausea And Vomiting and Other (See Comments)    Becomes incoherent   Social  History: Social History   Socioeconomic History  . Marital status: Single    Spouse name: Not on file  . Number of children: Not on file  . Years of education: Not on file  . Highest education level: Not on file  Occupational History  . Not on file  Tobacco Use  . Smoking status: Never  . Smokeless tobacco: Never  Substance and Sexual Activity  . Alcohol use: Yes    Comment: mild  . Drug use: No  . Sexual activity: Yes    Birth control/protection: Post-menopausal  Other Topics Concern  . Not on file  Social History Narrative  . Not on file   Social Determinants of Health   Financial Resource Strain: Not on file  Food Insecurity: Not on file  Transportation Needs: Not on file  Physical Activity: Not on file  Stress: Not on file  Social Connections: Not on file   Lives in a ***. Smoking: *** Occupation: ***  Environmental HistoryFreight forwarder in the house: Estate agent in the family room: {Blank single:19197::"yes","no"} Carpet in the bedroom: {Blank single:19197::"yes","no"} Heating: {Blank single:19197::"electric","gas","heat pump"} Cooling: {Blank single:19197::"central","window","heat pump"} Pet: {Blank single:19197::"yes ***","no"}  Family History: Family History  Problem Relation Age of Onset  . Anesthesia problems Mother        Nausea  . Heart disease Father   . Heart disease Maternal Grandfather   . Diabetes Paternal Grandfather    Problem                               Relation Asthma                                   ***  Eczema                                *** Food allergy                          *** Allergic rhino conjunctivitis     ***  Review of Systems  Constitutional:  Negative for appetite change, chills, fever and unexpected weight change.  HENT:  Negative for congestion and rhinorrhea.   Eyes:  Negative for itching.  Respiratory:  Negative for cough, chest tightness, shortness of breath and wheezing.    Cardiovascular:  Negative for chest pain.  Gastrointestinal:  Negative for abdominal pain.  Genitourinary:  Negative for difficulty urinating.  Skin:  Negative for rash.  Neurological:  Negative for headaches.   Objective: LMP 11/28/2008  There is no height or weight on file to calculate BMI. Physical Exam Vitals and nursing note reviewed.  Constitutional:      Appearance: Normal appearance. She is well-developed.  HENT:     Head: Normocephalic and atraumatic.     Right Ear: Tympanic membrane and external ear normal.     Left Ear: Tympanic membrane and external ear normal.     Nose: Nose normal.     Mouth/Throat:     Mouth: Mucous membranes are moist.     Pharynx: Oropharynx is clear.  Eyes:     Conjunctiva/sclera: Conjunctivae normal.  Cardiovascular:     Rate and Rhythm: Normal rate and regular rhythm.     Heart sounds: Normal heart sounds. No murmur heard.    No friction rub. No gallop.  Pulmonary:     Effort: Pulmonary effort is normal.     Breath sounds: Normal breath sounds. No wheezing, rhonchi or rales.  Musculoskeletal:     Cervical back: Neck supple.  Skin:    General: Skin is warm.     Findings: No rash.  Neurological:     Mental Status: She is alert and oriented to person, place, and time.  Psychiatric:        Behavior: Behavior normal.  The plan was reviewed with the patient/family, and all questions/concerned were addressed.  It was my pleasure to see Sandra [name] today and participate in her care. Please feel free to contact me with any questions or concerns.  Sincerely,  Rexene Alberts, DO Allergy & Immunology  [facility] office: [place] office: [phone]

## 2022-08-29 ENCOUNTER — Ambulatory Visit (INDEPENDENT_AMBULATORY_CARE_PROVIDER_SITE_OTHER): Payer: No Typology Code available for payment source | Admitting: Allergy

## 2022-08-29 ENCOUNTER — Encounter: Payer: Self-pay | Admitting: Allergy

## 2022-08-29 ENCOUNTER — Other Ambulatory Visit: Payer: Self-pay

## 2022-08-29 VITALS — BP 88/62 | HR 86 | Temp 98.0°F | Resp 16 | Ht 67.0 in | Wt 158.8 lb

## 2022-08-29 DIAGNOSIS — T7840XD Allergy, unspecified, subsequent encounter: Secondary | ICD-10-CM | POA: Diagnosis not present

## 2022-08-29 DIAGNOSIS — T783XXA Angioneurotic edema, initial encounter: Secondary | ICD-10-CM | POA: Insufficient documentation

## 2022-08-29 DIAGNOSIS — T783XXD Angioneurotic edema, subsequent encounter: Secondary | ICD-10-CM

## 2022-08-29 MED ORDER — EPINEPHRINE 0.3 MG/0.3ML IJ SOAJ: 0.3000 mg | INTRAMUSCULAR | 1 refills | Status: DC | PRN

## 2022-08-29 NOTE — Patient Instructions (Signed)
Swelling episodes I'm not sure what triggered your episodes but you have eaten all those foods since then with no issues which is reassuring. I do recommend that you avoid precut fruit slices and prewashed salads as they do contain some preservatives which I do not have testing for.  Keep track of episodes and take pictures. I have prescribed epinephrine injectable device and demonstrated proper use. For mild symptoms you can take over the counter antihistamines such as Benadryl 1-2 tablets = 25-'50mg'$ .and monitor symptoms closely. If symptoms worsen or if you have severe symptoms including breathing issues, throat closure, significant swelling, whole body hives, severe diarrhea and vomiting, lightheadedness then inject epinephrine and seek immediate medical care afterwards. Emergency action plan given. Get bloodwork We are ordering labs, so please allow 1-2 weeks for the results to come back. With the newly implemented Cures Act, the labs might be visible to you at the same time that they become visible to me. However, I will not address the results until all of the results are back, so please be patient.  In the meantime, continue recommendations in your patient instructions, including avoidance measures (if applicable), until you hear from me.  Follow up in 3 months or sooner if needed.

## 2022-08-29 NOTE — Assessment & Plan Note (Signed)
2 episodes of angioedema. First one limited to lips with no pruritus/rash. Resolved within 24 hours. Second one left sided tongue swelling with no pruritus/rash. Resolved within 1 hour after taking benadryl. Patient had a new type of salad dressing with the first episode but since then she has eaten all the foods with no issues. Not on ace-inhibitor and no personal/family history of angioedema. Medical history significant for breast cancer in remission and low blood pressure.  Discussed with patient that I'm not sure what triggered these episodes but the fact that she has eaten all those foods since then with no issues is reassuring and less likely to be due to foods.  No indication at this time to do any food skin testing.  Recommend to avoid precut fruit slices and prewashed salads as they do contain some preservatives which I do not have testing for.  Keep track of episodes and take pictures. I have prescribed epinephrine injectable device and demonstrated proper use. For mild symptoms you can take over the counter antihistamines such as Benadryl 1-2 tablets = 25-'50mg'$ .and monitor symptoms closely. If symptoms worsen or if you have severe symptoms including breathing issues, throat closure, significant swelling, whole body hives, severe diarrhea and vomiting, lightheadedness then inject epinephrine and seek immediate medical care afterwards. Emergency action plan given. Get bloodwork to rule out other etiologies.

## 2022-09-16 ENCOUNTER — Ambulatory Visit
Admission: RE | Admit: 2022-09-16 | Discharge: 2022-09-16 | Disposition: A | Discharge status: Home / Self Care | Payer: No Typology Code available for payment source | Source: Ambulatory Visit | Attending: Family Medicine | Admitting: Family Medicine

## 2022-09-16 DIAGNOSIS — M8580 Other specified disorders of bone density and structure, unspecified site: Secondary | ICD-10-CM

## 2022-11-28 ENCOUNTER — Ambulatory Visit: Payer: No Typology Code available for payment source | Admitting: Allergy

## 2024-05-27 ENCOUNTER — Ambulatory Visit: Admitting: Allergy

## 2024-06-10 ENCOUNTER — Other Ambulatory Visit: Payer: Self-pay

## 2024-06-10 ENCOUNTER — Encounter: Payer: Self-pay | Admitting: Allergy & Immunology

## 2024-06-10 ENCOUNTER — Ambulatory Visit (INDEPENDENT_AMBULATORY_CARE_PROVIDER_SITE_OTHER): Admitting: Allergy & Immunology

## 2024-06-10 VITALS — BP 126/74 | HR 78 | Temp 97.4°F | Resp 18 | Ht 66.14 in | Wt 155.4 lb

## 2024-06-10 DIAGNOSIS — T783XXD Angioneurotic edema, subsequent encounter: Secondary | ICD-10-CM

## 2024-06-10 DIAGNOSIS — G8929 Other chronic pain: Secondary | ICD-10-CM

## 2024-06-10 DIAGNOSIS — R519 Headache, unspecified: Secondary | ICD-10-CM

## 2024-06-10 DIAGNOSIS — T7840XD Allergy, unspecified, subsequent encounter: Secondary | ICD-10-CM | POA: Diagnosis not present

## 2024-06-10 NOTE — Progress Notes (Signed)
 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 Angioedema with allergic reactions - We will look into these labs to rule out weird causes of swelling, including hereditary angioedema. - We are going to get some autoimmune labs as well to rule this out. - This might help us  to figure out what is triggering these symptoms.  2. Allergic reaction - We are going to give you a sheet to choose which foods you want to test next time. - Circle the foods that have been associated with your swelling episodes.   3. Return in about 2 weeks (around 06/24/2024) for SELECTED FOOD TESTING. You can have the follow up appointment with Dr. Iva or a Nurse Practicioner (our Nurse Practitioners are excellent and always have Physician oversight!).   Subjective:   Sandra [name] is a 68 y.o. female presenting today for follow up of  Chief Complaint  Patient presents with   Follow-up    Swelling around chin and mouth happens months apart.    Sandra [name] has a history of the following: Patient Active Problem List   Diagnosis Date Noted   Angio-edema 08/29/2022   Bilateral malignant neoplasm of breast in female, estrogen receptor positive (HCC) 01/22/2018   Hot flashes 01/20/2015   Patellofemoral stress syndrome of right knee 07/19/2014   Loss of transverse plantar arch 07/19/2014   Other malaise and fatigue 01/21/2014   Sinusitis, acute maxillary 05/13/2013   Facial dermatitis 09/07/2012   Screening for malignant neoplasm of cervix 08/07/2012   Encounter for routine gynecological examination 08/07/2012   Uterine fibroid 08/07/2012   Osteopenia 07/20/2012   GERD (gastroesophageal reflux disease) 07/20/2012   Plantar fasciitis 07/20/2012   Bilateral breast cancer (HCC) 08/15/2011   History of breast reconstruction 08/15/2011     History obtained from: chart review and patient.  Discussed the use of AI scribe software for clinical note transcription with the patient and/or guardian, who gave verbal consent to proceed.  [name] is a 68 y.o. female presenting for a follow up visit.  She was last seen in November 2023 by Dr. Luke.  At that time, she had experienced 2 episodes of angioedema.  She recommended avoiding precut fruit slices and prewash salads because preservatives might be contributing to her symptoms.  She was given an epinephrine  autoinjector and an emergency action plan.  It looks like Dr. Luke ordered quite a few labs that she never actually obtained including an alpha gal, ANA, C3 and C4, inflammatory markers, C1 esterase inhibitor function and level, and thyroid  cascade as well as a chronic urticaria panel and SPEP.  Since last visit, she has mostly done well.  She experiences episodes of facial swelling that occur randomly and inconsistently in frequency. The most recent episode was on May 10, 2024, with a previous mild episode three to four months prior, and another nearly two years ago. The swelling typically begins on one side of her face and spreads, stopping at a certain point. The first episode was severe enough to be noticeable by others.  During these episodes, she experiences itching and redness, but not hives. The swelling has extended to her throat, which she monitors closely. No sneezing, runny nose, or other allergy-like symptoms occur during these episodes.  She has visited urgent care during a previous episode and was treated with dexamethasone  and Benadryl,  which took about one and a half to two days for the symptoms to resolve. She does not have an EpiPen . She ensures to have Benadryl available in case of an episode.  She has not identified a consistent trigger for these episodes. They do not always occur around eating times, and she has not noticed any specific food consistently causing the  reaction. She considered preservatives in pre-sliced fruit but noted a recent episode after eating home-cut fruit.  There is no family history of similar symptoms. She does not have a history of asthma, wheezing, or significant allergy symptoms. She has a history of chronic sinus infections as a child and had tubes placed twice. She was evaluated for a deviated septum due to headaches but did not pursue surgery.     Otherwise, there have been no changes to her past medical history, surgical history, family history, or social history.    Review of systems otherwise negative other than that mentioned in the HPI.    Objective:   Blood pressure 126/74, pulse 78, temperature (!) 97.4 F (36.3 C), temperature source Temporal, resp. rate 18, height 5' 6.14 (1.68 m), weight 155 lb 6.4 oz (70.5 kg), last menstrual period 11/28/2008, SpO2 98%. Body mass index is 24.97 kg/m.    Physical Exam Vitals reviewed.  Constitutional:      Appearance: She is well-developed.     Comments: Pleasant.   HENT:     Head: Normocephalic and atraumatic.     Right Ear: Tympanic membrane, ear canal and external ear normal.     Left Ear: Tympanic membrane, ear canal and external ear normal.     Nose: No nasal deformity, septal deviation, mucosal edema or rhinorrhea.     Right Turbinates: Enlarged, swollen and pale.     Left Turbinates: Enlarged, swollen and pale.     Right Sinus: No maxillary sinus tenderness or frontal sinus tenderness.     Left Sinus: No maxillary sinus tenderness or frontal sinus tenderness.     Mouth/Throat:     Mouth: Mucous membranes are not pale and not dry.     Pharynx: Uvula midline.  Eyes:     General: Lids are normal. No allergic shiner.       Right eye: No discharge.        Left eye: No discharge.     Conjunctiva/sclera: Conjunctivae normal.     Right eye: Right conjunctiva is not injected. No chemosis.    Left eye: Left conjunctiva is not injected. No chemosis.    Pupils:  Pupils are equal, round, and reactive to light.  Cardiovascular:     Rate and Rhythm: Normal rate and regular rhythm.     Heart sounds: Normal heart sounds.  Pulmonary:     Effort: Pulmonary effort is normal. No tachypnea, accessory muscle usage or respiratory distress.     Breath sounds: Normal breath sounds. No wheezing, rhonchi or rales.     Comments: Moving air well in all lung fields. No increased work of breathing noted.  Chest:     Chest wall: No tenderness.  Lymphadenopathy:     Cervical: No cervical adenopathy.  Skin:    Coloration: Skin is not pale.     Findings: No abrasion, erythema, petechiae or rash. Rash is not papular, urticarial or vesicular.  Neurological:     Mental Status: She is alert.  Psychiatric:        Behavior: Behavior is cooperative.      Diagnostic studies: labs sent  instead       Marty Shaggy, MD  [facility] 

## 2024-06-10 NOTE — Patient Instructions (Addendum)
 1. Angioedema with allergic reactions - We will look into these labs to rule out weird causes of swelling, including hereditary angioedema. - We are going to get some autoimmune labs as well to rule this out. - This might help us  to figure out what is triggering these symptoms.  2. Allergic reaction - We are going to give you a sheet to choose which foods you want to test next time. - Circle the foods that have been associated with your swelling episodes.   3. Return in about 2 weeks (around 06/24/2024) for SELECTED FOOD TESTING. You can have the follow up appointment with Dr. Iva or a Nurse Practicioner (our Nurse Practitioners are excellent and always have Physician oversight!).    Please inform us  of any Emergency Department visits, hospitalizations, or changes in symptoms. Call us  before going to the ED for breathing or allergy symptoms since we might be able to fit you in for a sick visit. Feel free to contact us  anytime with any questions, problems, or concerns.  It was a pleasure to meet you today!  Websites that have reliable patient information: 1. American Academy of Asthma, Allergy, and Immunology: www.aaaai.org 2. Food Allergy Research and Education (FARE): foodallergy.org 3. Mothers of Asthmatics: http://www.asthmacommunitynetwork.org 4. American College of Allergy, Asthma, and Immunology: www.acaai.org      "Like" us  on Facebook and Instagram for our latest updates!      A healthy democracy works best when [employer] participate! Make sure you are registered to vote! If you have moved or changed any of your contact information, you will need to get this updated before voting! Scan the QR codes below to learn more!

## 2024-06-16 LAB — THYROID ANTIBODIES (THYROPEROXIDASE & THYROGLOBULIN)
Thyroglobulin Antibody: <1 [IU]/mL (ref 0.0–0.9)
Thyroperoxidase Ab SerPl-aCnc: 12 [IU]/mL (ref 0–34)

## 2024-06-16 LAB — CMP14+EGFR
ALT: 30 IU/L (ref 0–32)
AST: 34 IU/L (ref 0–40)
Albumin: 4.7 g/dL (ref 3.9–4.9)
Alkaline Phosphatase: 85 IU/L (ref 44–121)
BUN/Creatinine Ratio: 24 (ref 12–28)
BUN: 15 mg/dL (ref 8–27)
Bilirubin Total: 0.4 mg/dL (ref 0.0–1.2)
CO2: 27 mmol/L (ref 20–29)
Calcium: 9.8 mg/dL (ref 8.7–10.3)
Chloride: 101 mmol/L (ref 96–106)
Creatinine, Ser: 0.63 mg/dL (ref 0.57–1.00)
Globulin, Total: 2.1 g/dL (ref 1.5–4.5)
Glucose: 94 mg/dL (ref 70–99)
Potassium: 4.6 mmol/L (ref 3.5–5.2)
Sodium: 142 mmol/L (ref 134–144)
Total Protein: 6.8 g/dL (ref 6.0–8.5)
eGFR: 98 mL/min/1.73 (ref >59)

## 2024-06-16 LAB — C1 ESTERASE INHIBITOR: C1INH SerPl-mCnc: 33 mg/dL (ref 21–39)

## 2024-06-16 LAB — C3 AND C4
Complement C3, Serum: 120 mg/dL (ref 82–167)
Complement C4, Serum: 28 mg/dL (ref 12–38)

## 2024-06-16 LAB — ANTINUCLEAR ANTIBODIES, IFA: ANA Titer 1: NEGATIVE

## 2024-06-16 LAB — SEDIMENTATION RATE: Sed Rate: 7 mm/h (ref 0–40)

## 2024-06-16 LAB — C-REACTIVE PROTEIN: CRP: 1 mg/L (ref 0–10)

## 2024-06-16 LAB — TRYPTASE: Tryptase: 7.6 ug/L (ref 2.2–13.2)

## 2024-06-16 LAB — C1 ESTERASE INHIBITOR, FUNCTIONAL: C1INH Functional/C1INH Total MFr SerPl: >105 %{normal}

## 2024-06-16 LAB — COMPLEMENT COMPONENT C1Q: Complement C1Q: 11 mg/dL (ref 10.3–20.5)

## 2024-06-18 ENCOUNTER — Ambulatory Visit: Payer: Self-pay | Admitting: Allergy & Immunology

## 2024-06-24 ENCOUNTER — Ambulatory Visit (INDEPENDENT_AMBULATORY_CARE_PROVIDER_SITE_OTHER): Admitting: Allergy & Immunology

## 2024-06-24 ENCOUNTER — Encounter: Payer: Self-pay | Admitting: Allergy & Immunology

## 2024-06-24 DIAGNOSIS — T783XXD Angioneurotic edema, subsequent encounter: Secondary | ICD-10-CM

## 2024-06-24 DIAGNOSIS — T7840XD Allergy, unspecified, subsequent encounter: Secondary | ICD-10-CM

## 2024-06-24 MED ORDER — EPINEPHRINE 0.3 MG/0.3ML IJ SOAJ: 0.3000 mg | Freq: Once | INTRAMUSCULAR | 2 refills | Status: AC

## 2024-06-24 NOTE — Progress Notes (Signed)
 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 Angioedema with allergic reactions - Thankfully labs were all negative which is good news. - Testing was negative to most of the items tested today. - Testing was slightly reactive to cashew and hazelnut. - There is a the low positive predictive value of food allergy testing and hence the high possibility of false positives. - In contrast, food allergy testing has a high negative predictive value, therefore if testing is negative we can be relatively assured that they are indeed negative.  - Copy of testing resulted provided. - I think that the cahsew and hazelnut are FALSE POSITIVES since you eat them without a problem.  - Note future triggers to help with any future workup.  - We could consider Xolair in the future since her symptoms are responsive to antihistamines.  Mom  3. Return in about 6 months (around 12/22/2024). You can have the follow up appointment with Dr. Iva or a Nurse Practicioner (our Nurse Practitioners are excellent and always have Physician oversight!).    Subjective:   Sandra [name] is a 68 y.o. female presenting today for follow up of No chief complaint on file.   Sandra [name] has a history of the following: Patient Active Problem List   Diagnosis Date Noted   Angio-edema 08/29/2022   Bilateral malignant neoplasm of breast in female, estrogen receptor positive (HCC) 01/22/2018   Hot flashes 01/20/2015   Patellofemoral stress syndrome of right knee 07/19/2014   Loss of transverse plantar arch 07/19/2014   Other malaise and fatigue 01/21/2014   Sinusitis, acute maxillary 05/13/2013   Facial dermatitis 09/07/2012   Screening for malignant neoplasm of cervix 08/07/2012   Encounter for  routine gynecological examination 08/07/2012   Uterine fibroid 08/07/2012   Osteopenia 07/20/2012   GERD (gastroesophageal reflux disease) 07/20/2012   Plantar fasciitis 07/20/2012   Bilateral breast cancer (HCC) 08/15/2011   History of breast reconstruction 08/15/2011    History obtained from: chart review and patient.  Discussed the use of AI scribe software for clinical note transcription with the patient and/or guardian, who gave verbal consent to proceed.  Sandra [name] is a 68 y.o. female presenting for skin testing. She was last seen on September 11th, 2024. We could not do testing because her insurance company does not cover testing on the same day as a New Patient visit. She has been off of all antihistamines 3 days in anticipation of the testing.   At that visit, we obtained labs to rule out weird causes of swelling including HAE.  For allergic reaction, we gave her a sheet to choose foods that she wanted tested.  She presents today for testing to this foods.  Labs were normal for hereditary angioedema as well as the labs we sent due to her history of hives.  She consumes these nuts occasionally without issues. No recent hives or itching since her last visit.  She inquired about oral allergy syndrome, noting a past incident with avocado peel and apple skin, but has not experienced typical symptoms associated with this condition. Her environmental allergy symptoms are minimal, with no significant sneezing or itching around the eyes.  Previous laboratory tests were conducted and returned normal results.  She did ask about oral allergy syndrome which is  what her friend has.  I explained the etiology of this to her and as she has no environmental allergy symptoms I did not think that testing for that was indicated today.  She was in agreement.  Otherwise, there have been no changes to her past medical history, surgical history, family history, or social history.    Review of systems  otherwise negative other than that mentioned in the HPI.    Objective:   Last menstrual period 11/28/2008. There is no height or weight on file to calculate BMI.    Physical exam deferred since this was a skin testing appointment only.   Diagnostic studies:   Allergy Studies:     Food Adult Perc - 06/24/24 0900     Time Antigen Placed 0915    Allergen Manufacturer Jestine    Location Back    Number of allergen test 21     Control-buffer 50% Glycerol Negative    Control-Histamine 2+    1. Peanut Negative    2. Soybean Negative    3. Wheat Negative    4. Sesame Negative    5. Milk, Cow Negative    6. Casein Negative    7. Egg White, Chicken Negative    8. Shellfish Mix Negative    9. Fish Mix Negative    10. Cashew --   3 x 5   11. Walnut Food Negative    12. Almond Negative    13. Hazelnut --   3 x 5   14. Pecan Food Negative    15. Pistachio Negative    16. [place] Nut Negative    17. Coconut Negative    48. Avocado Negative    72. Mustard Negative          Allergy testing results were read and interpreted by myself, documented by clinical staff.      Marty Shaggy, MD  [facility] 

## 2024-06-24 NOTE — Patient Instructions (Addendum)
 1. Angioedema with allergic reactions - Thankfully labs were all negative which is good news. - Testing was negative to most of the items tested today. - Testing was slightly reactive to cashew and hazelnut. - There is a the low positive predictive value of food allergy testing and hence the high possibility of false positives. - In contrast, food allergy testing has a high negative predictive value, therefore if testing is negative we can be relatively assured that they are indeed negative.  - Copy of testing resulted provided. - I think that the cahsew and hazelnut are FALSE POSITIVES since you eat them without a problem.  - Note future triggers to help with any future workup.   3. Return in about 6 months (around 12/22/2024). You can have the follow up appointment with Dr. Iva or a Nurse Practicioner (our Nurse Practitioners are excellent and always have Physician oversight!).    Please inform us  of any Emergency Department visits, hospitalizations, or changes in symptoms. Call us  before going to the ED for breathing or allergy symptoms since we might be able to fit you in for a sick visit. Feel free to contact us  anytime with any questions, problems, or concerns.  It was a pleasure to see you again today!  Websites that have reliable patient information: 1. American Academy of Asthma, Allergy, and Immunology: www.aaaai.org 2. Food Allergy Research and Education (FARE): foodallergy.org 3. Mothers of Asthmatics: http://www.asthmacommunitynetwork.org 4. American College of Allergy, Asthma, and Immunology: www.acaai.org      "Like" us  on Facebook and Instagram for our latest updates!      A healthy democracy works best when [employer] participate! Make sure you are registered to vote! If you have moved or changed any of your contact information, you will need to get this updated before voting! Scan the QR codes below to learn more!       Food Adult Perc - 06/24/24 0900      Time Antigen Placed 0915    Allergen Manufacturer Jestine    Location Back    Number of allergen test 21     Control-buffer 50% Glycerol Negative    Control-Histamine 2+    1. Peanut Negative    2. Soybean Negative    3. Wheat Negative    4. Sesame Negative    5. Milk, Cow Negative    6. Casein Negative    7. Egg White, Chicken Negative    8. Shellfish Mix Negative    9. Fish Mix Negative    10. Cashew --   3 x 5   11. Walnut Food Negative    12. Almond Negative    13. Hazelnut --   3 x 5   14. Pecan Food Negative    15. Pistachio Negative    16. [place] Nut Negative    17. Coconut Negative    48. Avocado Negative    72. Mustard Negative

## 2024-12-16 ENCOUNTER — Ambulatory Visit: Admitting: Allergy & Immunology
